# Patient Record
Sex: Female | Born: 1990 | Race: Black or African American | Hispanic: No | Marital: Single | Smoking: Never smoker
Health system: Southern US, Community
[De-identification: ages and names within clinical notes are randomized; demographics above are authoritative.]

## PROBLEM LIST (undated history)

## (undated) DIAGNOSIS — Z789 Other specified health status: Secondary | ICD-10-CM

## (undated) HISTORY — PX: HERNIA REPAIR: SHX51

---
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
## 2012-04-26 ENCOUNTER — Telehealth (HOSPITAL_COMMUNITY): Payer: Self-pay | Admitting: *Deleted

## 2012-04-26 ENCOUNTER — Encounter (HOSPITAL_COMMUNITY): Payer: Self-pay | Admitting: *Deleted

## 2012-04-26 NOTE — Telephone Encounter (Signed)
Preadmission screen  

## 2012-05-03 ENCOUNTER — Inpatient Hospital Stay (HOSPITAL_COMMUNITY)
Admission: AD | Admit: 2012-05-03 | Discharge: 2012-05-06 | DRG: 775 | Disposition: A | Discharge status: Home / Self Care | Payer: Medicaid Other | Source: Ambulatory Visit | Attending: Obstetrics and Gynecology | Admitting: Obstetrics and Gynecology

## 2012-05-03 ENCOUNTER — Encounter (HOSPITAL_COMMUNITY): Payer: Self-pay | Admitting: *Deleted

## 2012-05-03 DIAGNOSIS — Z22330 Carrier of Group B streptococcus: Secondary | ICD-10-CM

## 2012-05-03 DIAGNOSIS — O99892 Other specified diseases and conditions complicating childbirth: Principal | ICD-10-CM | POA: Diagnosis present

## 2012-05-03 HISTORY — DX: Other specified health status: Z78.9

## 2012-05-03 NOTE — MAU Note (Signed)
Pt G1 at 40.5wks having contractions.  Denies leaking or problems with pregnancy.

## 2012-05-04 ENCOUNTER — Encounter (HOSPITAL_COMMUNITY): Payer: Self-pay | Admitting: *Deleted

## 2012-05-04 LAB — CBC
Hemoglobin: 12.7 g/dL (ref 12.0–15.0)
RBC: 4.1 MIL/uL (ref 3.87–5.11)
WBC: 12.1 10*3/uL — ABNORMAL HIGH (ref 4.0–10.5)

## 2012-05-04 LAB — RPR: RPR Ser Ql: NONREACTIVE

## 2012-05-04 LAB — ABO/RH: ABO/RH(D): O POS

## 2012-05-04 MED ORDER — PRENATAL MULTIVITAMIN CH
1.0000 | ORAL_TABLET | Freq: Every day | ORAL | Status: DC
  Filled 2012-05-04: qty 1

## 2012-05-04 MED ORDER — FLEET ENEMA 7-19 GM/118ML RE ENEM: 1.0000 | ENEMA | RECTAL | Status: DC | PRN

## 2012-05-04 MED ORDER — ZOLPIDEM TARTRATE 5 MG PO TABS: 5.0000 mg | ORAL_TABLET | Freq: Every evening | ORAL | Status: DC | PRN

## 2012-05-04 MED ORDER — IBUPROFEN 600 MG PO TABS
600.0000 mg | ORAL_TABLET | Freq: Four times a day (QID) | ORAL | Status: DC
  Filled 2012-05-04: qty 1

## 2012-05-04 MED ORDER — DIPHENHYDRAMINE HCL 25 MG PO CAPS: 25.0000 mg | ORAL_CAPSULE | Freq: Four times a day (QID) | ORAL | Status: DC | PRN

## 2012-05-04 MED ORDER — PENICILLIN G POTASSIUM 5000000 UNITS IJ SOLR
2.5000 10*6.[IU] | INTRAVENOUS | Status: DC
  Administered 2012-05-04: 2.5 10*6.[IU] via INTRAVENOUS
  Filled 2012-05-04 (×3): qty 2.5

## 2012-05-04 MED ORDER — ONDANSETRON HCL 4 MG/2ML IJ SOLN: 4.0000 mg | INTRAMUSCULAR | Status: DC | PRN

## 2012-05-04 MED ORDER — SENNOSIDES-DOCUSATE SODIUM 8.6-50 MG PO TABS
2.0000 | ORAL_TABLET | Freq: Every day | ORAL | Status: DC
  Administered 2012-05-04: 2 via ORAL

## 2012-05-04 MED ORDER — SIMETHICONE 80 MG PO CHEW: 80.0000 mg | CHEWABLE_TABLET | ORAL | Status: DC | PRN

## 2012-05-04 MED ORDER — BENZOCAINE-MENTHOL 20-0.5 % EX AERO
1.0000 | INHALATION_SPRAY | CUTANEOUS | Status: DC | PRN
Start: 2012-05-04 — End: 2012-05-06
  Filled 2012-05-04: qty 56

## 2012-05-04 MED ORDER — WITCH HAZEL-GLYCERIN EX PADS
1.0000 | MEDICATED_PAD | CUTANEOUS | Status: DC | PRN
Start: 2012-05-04 — End: 2012-05-06

## 2012-05-04 MED ORDER — IBUPROFEN 100 MG/5ML PO SUSP
600.0000 mg | Freq: Four times a day (QID) | ORAL | Status: DC
  Administered 2012-05-04 – 2012-05-06 (×9): 600 mg via ORAL
  Filled 2012-05-04 (×13): qty 30

## 2012-05-04 MED ORDER — OXYTOCIN BOLUS FROM INFUSION
500.0000 mL | Freq: Once | INTRAVENOUS | Status: DC
  Filled 2012-05-04: qty 1000
  Filled 2012-05-04: qty 500

## 2012-05-04 MED ORDER — OXYCODONE-ACETAMINOPHEN 5-325 MG PO TABS: 1.0000 | ORAL_TABLET | ORAL | Status: DC | PRN

## 2012-05-04 MED ORDER — PENICILLIN G POTASSIUM 5000000 UNITS IJ SOLR
5.0000 10*6.[IU] | Freq: Once | INTRAMUSCULAR | Status: AC
  Administered 2012-05-04: 5 10*6.[IU] via INTRAVENOUS
  Filled 2012-05-04: qty 5

## 2012-05-04 MED ORDER — IBUPROFEN 600 MG PO TABS: 600.0000 mg | ORAL_TABLET | Freq: Four times a day (QID) | ORAL | Status: DC | PRN

## 2012-05-04 MED ORDER — LANOLIN HYDROUS EX OINT: TOPICAL_OINTMENT | CUTANEOUS | Status: DC | PRN

## 2012-05-04 MED ORDER — LIDOCAINE HCL (PF) 1 % IJ SOLN
30.0000 mL | INTRAMUSCULAR | Status: DC | PRN
  Filled 2012-05-04: qty 30

## 2012-05-04 MED ORDER — TETANUS-DIPHTH-ACELL PERTUSSIS 5-2.5-18.5 LF-MCG/0.5 IM SUSP: 0.5000 mL | Freq: Once | INTRAMUSCULAR | Status: DC

## 2012-05-04 MED ORDER — CITRIC ACID-SODIUM CITRATE 334-500 MG/5ML PO SOLN: 30.0000 mL | ORAL | Status: DC | PRN

## 2012-05-04 MED ORDER — ONDANSETRON HCL 4 MG/2ML IJ SOLN
4.0000 mg | Freq: Four times a day (QID) | INTRAMUSCULAR | Status: DC | PRN
  Administered 2012-05-04: 4 mg via INTRAVENOUS
  Filled 2012-05-04: qty 2

## 2012-05-04 MED ORDER — LACTATED RINGERS IV SOLN
INTRAVENOUS | Status: DC
  Administered 2012-05-04: 125 mL/h via INTRAVENOUS

## 2012-05-04 MED ORDER — OXYTOCIN 20 UNITS IN LACTATED RINGERS INFUSION - SIMPLE: 125.0000 mL/h | INTRAVENOUS | Status: AC

## 2012-05-04 MED ORDER — LACTATED RINGERS IV SOLN: 500.0000 mL | INTRAVENOUS | Status: DC | PRN

## 2012-05-04 MED ORDER — BUTORPHANOL TARTRATE 2 MG/ML IJ SOLN
1.0000 mg | Freq: Once | INTRAMUSCULAR | Status: AC
  Administered 2012-05-04: 1 mg via INTRAVENOUS
  Filled 2012-05-04: qty 1

## 2012-05-04 MED ORDER — ONDANSETRON HCL 4 MG PO TABS: 4.0000 mg | ORAL_TABLET | ORAL | Status: DC | PRN

## 2012-05-04 MED ORDER — ACETAMINOPHEN 325 MG PO TABS: 650.0000 mg | ORAL_TABLET | ORAL | Status: DC | PRN

## 2012-05-04 MED ORDER — MEASLES, MUMPS & RUBELLA VAC ~~LOC~~ INJ
0.5000 mL | INJECTION | Freq: Once | SUBCUTANEOUS | Status: DC
  Filled 2012-05-04: qty 0.5

## 2012-05-04 MED ORDER — OXYTOCIN 20 UNITS IN LACTATED RINGERS INFUSION - SIMPLE: 125.0000 mL/h | Freq: Once | INTRAVENOUS | Status: DC

## 2012-05-04 MED ORDER — DIBUCAINE 1 % RE OINT
1.0000 | TOPICAL_OINTMENT | RECTAL | Status: DC | PRN
Start: 2012-05-04 — End: 2012-05-06

## 2012-05-04 NOTE — Progress Notes (Signed)
UR chart review completed.  

## 2012-05-04 NOTE — MAU Note (Signed)
Dr. Ambrose Mantle notified of pt, orders to watch and recheck in one hour rec'd.

## 2012-05-04 NOTE — H&P (Signed)
[name], Rasmussen NO.:  [id number]  MEDICAL RECORD NO.:  [id number]  LOCATION:  9165                          FACILITY:  [facility]  PHYSICIAN:  Malachi Pro. Ambrose Mantle, M.D. DATE OF BIRTH:  January 24, 1991  DATE OF ADMISSION:  05/04/2012 DATE OF DISCHARGE:                             HISTORY & PHYSICAL   HISTORY OF PRESENT ILLNESS:  This is an 21 year old black female, para 0, gravida 1, EDC Apr 27, 2012, admitted in labor.  Blood group and type O positive, negative antibody, rubella immune, RPR nonreactive.  Urine culture negative.  Hepatitis B surface antigen negative.  HIV negative. GC and Chlamydia negative.  Hemoglobin AA trimester screen negative. Cystic fibrosis negative.  AFP negative.  1-hour Glucola 65.  Group B strep positive.  The patient's prenatal course was essentially uncomplicated.  She came to the office on May 03, 2012 with irregular contractions.  Her cervix was 1 cm dilated.  She returned to Maternity Admission Unit late on May 03, 2012, and the cervix was 1+ cm.  She was observed in Maternity Admission Unit.  Progressed to 3 cm, was admitted to the hospital, declined an epidural, progressed to 7 cm, and then to full dilatation with intact membranes.  She delivered spontaneously OA over an intact perineum a living female infant.  Weight is pending. Apgars were 9 and 9 at 1 and 5 minutes.  Placenta was intact.  The uterus was normal.  There were no lacerations repaired.  Blood loss was about 300 mL.  PAST MEDICAL HISTORY:  Negative.  SURGICAL HISTORY:  Hernia repair at 31 months of age, bilateral hernias.  ALLERGIES:  No known allergies.  FAMILY HISTORY:  Mother with anxiety, arthritis, depression.  Maternal grandmother with breast cancer.  Paternal grandfather with a heart attack and paternal grandmother with a stroke.  PHYSICAL EXAMINATION:  VITAL SIGNS:  Blood pressure 125/54, temperature 98.4, pulse 87, respirations 20. HEART:  Normal size and sounds.   No murmurs. LUNGS:  Clear to auscultation. ABDOMEN:  Soft.  The patient is now postpartum.  ADMITTING IMPRESSION:  Intrauterine pregnancy at 40 weeks and 6 days, with delivery.  The patient is prepared for postpartum care.     Malachi Pro. Ambrose Mantle, M.D.    TFH/MEDQ  D:  05/04/2012  T:  05/04/2012  Job:  [id number]

## 2012-05-04 NOTE — MAU Note (Signed)
Dr. Ambrose Mantle notified of Pt SVE, orders rec'd for admission.

## 2012-05-05 LAB — CBC
MCH: 30.2 pg (ref 26.0–34.0)
MCHC: 32.8 g/dL (ref 30.0–36.0)
Platelets: 214 10*3/uL (ref 150–400)
RBC: 3.41 MIL/uL — ABNORMAL LOW (ref 3.87–5.11)

## 2012-05-05 NOTE — Progress Notes (Signed)
Patient ID: Lindsey Rasmussen, female   DOB: Jul 02, 1991, 21 y.o.   MRN: [id number] #1 afebrile BP normal No problems

## 2012-05-05 NOTE — Clinical Social Work Psychosocial (Signed)
    Clinical Social Work Department BRIEF PSYCHOSOCIAL ASSESSMENT 05/05/2012  Patient:  Lindsey Rasmussen, Lindsey Rasmussen     Account Number:  [id number]     Admit date:  05/03/2012  Clinical Social Worker:  Andy Gauss  Date/Time:  05/05/2012 12:00 N  Referred by:  Physician  Date Referred:  05/05/2012 Referred for  Other - See comment   Other Referral:   Social situation   Interview type:  Patient Other interview type:    PSYCHOSOCIAL DATA Living Status:  PARENTS Admitted from facility:   Level of care:   Primary support name:  Crista Luria Primary support relationship to patient:  PARENT Degree of support available:   Involved    CURRENT CONCERNS Current Concerns  None Noted   Other Concerns:    SOCIAL WORK ASSESSMENT / PLAN Sw referral received to assess pt's "flat affect and questionable support system."  Pt lives with her mother, who she described as supportive.  FOB is supportive and involved, as per the pt.  She denies any depression or SI hx.  Pt answered this Sw questions appropriately, as Sw observed her bonding well with the infant.  She reports feeling comfortable handling the infant and expressed happiness about becoming a mother.  She has all the necessary supplies for the infant.  Pt did not appear to be "flat" during the conversation.  Sw is available to reassess if further concerns arise.   Assessment/plan status:  No Further Intervention Required Other assessment/ plan:   Information/referral to community resources:    PATIENT'S/FAMILY'S RESPONSE TO PLAN OF CARE: Pt was receptive to consult reason and cooperative.

## 2012-05-06 ENCOUNTER — Encounter (HOSPITAL_COMMUNITY): Payer: Self-pay | Admitting: Obstetrics and Gynecology

## 2012-05-06 MED ORDER — OXYCODONE-ACETAMINOPHEN 5-325 MG PO TABS: 1.0000 | ORAL_TABLET | Freq: Four times a day (QID) | ORAL | Status: AC | PRN

## 2012-05-06 MED ORDER — IBUPROFEN 100 MG/5ML PO SUSP: 800.0000 mg | Freq: Three times a day (TID) | ORAL | Status: DC | PRN

## 2012-05-06 NOTE — Discharge Summary (Signed)
Obstetric Discharge Summary Reason for Admission: onset of labor Prenatal Procedures: none Intrapartum Procedures: spontaneous vaginal delivery Postpartum Procedures: none Complications-Operative and Postpartum: none Hemoglobin  Date Value Range Status  05/05/2012 10.3* 12.0-15.0 (g/dL) Final     DELTA CHECK NOTED     REPEATED TO VERIFY     HCT  Date Value Range Status  05/05/2012 31.4* 36.0-46.0 (%) Final    Physical Exam:  General: alert and no distress Lochia: appropriate Uterine Fundus: firm   Discharge Diagnoses: Term Pregnancy-delivered  Discharge Information: Date: 05/06/2012 Activity: pelvic rest Diet: routine Medications: Ibuprofen, Percocet and vitamins Condition: stable Instructions: refer to practice specific booklet Discharge to: home Follow-up Information    Follow up with Bing Plume, MD. Schedule an appointment as soon as possible for a visit in 6 weeks.   Contact information:   [employer] [employer] [address] [place], [address] [place] [address] [phone]          Newborn Data: Live born female  Birth Weight: 6 lb 12.3 oz (3070 g) APGAR: 9,   Home with mother.  Lindsey Rasmussen,Lindsey Rasmussen 05/06/2012, 12:05 PM

## 2012-05-06 NOTE — Progress Notes (Signed)
Post Partum Day 2 Subjective: no complaints, up ad lib, tolerating PO and nl lochia, pain controlled  Objective: Blood pressure 131/85, pulse 66, temperature 98.6 F (37 C), temperature source Oral, resp. rate 20, height 5\' 6"  (1.676 m), weight 106.595 kg (235 lb), last menstrual period 08/20/2011, SpO2 99.00%, unknown if currently breastfeeding.  Physical Exam:  General: alert and no distress Lochia: appropriate Uterine Fundus: firm   Basename 05/05/12 0537 05/04/12 0240  HGB 10.3* 12.7  HCT 31.4* 37.2    Assessment/Plan: Discharge home  D/c with motrin/percocet/pnv.  F/u 6 weeks   LOS: 3 days   BOVARD,Forrestine Lecrone 05/06/2012, 11:47 AM

## 2012-05-08 ENCOUNTER — Inpatient Hospital Stay (HOSPITAL_COMMUNITY): Admission: RE | Admit: 2012-05-08 | Payer: Medicaid Other | Source: Ambulatory Visit

## 2013-05-11 ENCOUNTER — Encounter (HOSPITAL_COMMUNITY): Payer: Self-pay | Admitting: Emergency Medicine

## 2013-05-11 ENCOUNTER — Emergency Department (HOSPITAL_COMMUNITY): Payer: Self-pay

## 2013-05-11 ENCOUNTER — Emergency Department (INDEPENDENT_AMBULATORY_CARE_PROVIDER_SITE_OTHER): Payer: Self-pay

## 2013-05-11 ENCOUNTER — Emergency Department (INDEPENDENT_AMBULATORY_CARE_PROVIDER_SITE_OTHER)
Admission: EM | Admit: 2013-05-11 | Discharge: 2013-05-11 | Disposition: A | Discharge status: Home / Self Care | Payer: Self-pay | Source: Home / Self Care

## 2013-05-11 DIAGNOSIS — S9000XA Contusion of unspecified ankle, initial encounter: Secondary | ICD-10-CM

## 2013-05-11 DIAGNOSIS — S9001XA Contusion of right ankle, initial encounter: Secondary | ICD-10-CM

## 2013-05-11 NOTE — ED Notes (Signed)
Pt states she fell today and hurt her right knee and ankle. 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 Used an ice pack on ankle with little relief. Patient is alert and oriented.

## 2013-05-11 NOTE — ED Provider Notes (Signed)
History     CSN: [id number]  Arrival date & time 05/11/13  1324   None     Chief Complaint  Patient presents with  . Fall    (Consider location/radiation/quality/duration/timing/severity/associated sxs/prior treatment) HPI Comments: Pt slipped on a puddle in a classroom at 10am today, landing on R side of body when she fell.  Reports R knee and R ankle both hurt, but R knee is mild.  Pt is very worried about R ankle.   Patient is a 22 y.o. female presenting with fall. The history is provided by the patient.  Fall The accident occurred 3 to 5 hours ago. The fall occurred while walking. She landed on a hard floor. The point of impact was the right knee (right ankle). The pain is present in the right knee (r ankle). The pain is moderate. She was ambulatory at the scene. Pertinent negatives include no numbness, no loss of consciousness and no tingling. The symptoms are aggravated by ambulation and standing. She has tried ice for the symptoms. The treatment provided no relief.    Past Medical History  Diagnosis Date  . No pertinent past medical history   . SVD (spontaneous vaginal delivery) 05/06/2012    Past Surgical History  Procedure Laterality Date  . Hernia repair      bilateral at 4 mos    Family History  Problem Relation Age of Onset  . Anxiety disorder Mother   . Arthritis Mother   . Depression Mother   . Cancer Maternal Grandmother     breast  . Stroke Paternal Grandmother   . Heart attack Paternal Grandfather     History  Substance Use Topics  . Smoking status: Never Smoker   . Smokeless tobacco: Not on file  . Alcohol Use: No    OB History   Grav Para Term Preterm Abortions TAB SAB Ect Mult Living   1 1 1       1       Review of Systems  Musculoskeletal:       R ankle and knee pain after fall  Skin: Negative for color change and wound.  Neurological: Negative for tingling, loss of consciousness and numbness.    Allergies  Review of patient's  allergies indicates no known allergies.  Home Medications   Current Outpatient Rx  Name  Route  Sig  Dispense  Refill  . Pediatric Multivit-Minerals-C (FLINTSTONES GUMMIES PO)   Oral   Take 1 tablet by mouth daily.         Marland Kitchen ibuprofen (ADVIL,MOTRIN) 100 MG/5ML suspension   Oral   Take 40 mLs (800 mg total) by mouth every 8 (eight) hours as needed for fever.   473 mL   2     BP 146/90  Pulse 62  Temp(Src) 98.4 F (36.9 C) (Oral)  Resp 18  SpO2 100%  Breastfeeding? Unknown  Physical Exam  Constitutional: She appears well-developed and well-nourished. No distress.  HENT:  Head: Normocephalic and atraumatic.  Musculoskeletal:       Right knee: She exhibits abnormal patellar mobility. She exhibits normal range of motion, no swelling, no LCL laxity, no bony tenderness and no MCL laxity. Tenderness found. Lateral joint line tenderness noted.       Right ankle: She exhibits swelling. She exhibits normal range of motion, no deformity and normal pulse. Tenderness. Lateral malleolus and medial malleolus tenderness found. Achilles tendon exhibits no pain.  Pain with ROM R ankle  Skin: Skin is warm, dry  and intact. No abrasion and no bruising noted.    ED Course  Procedures (including critical care time)  Labs Reviewed - No data to display Dg Ankle Complete Right  05/11/2013   *RADIOLOGY REPORT*  Clinical Data: Right ankle pain after fall  RIGHT ANKLE - COMPLETE 3+ VIEW  Comparison: None.  Findings: No fracture or dislocation is noted.  Joint spaces are intact. No soft tissue abnormality is noted.  IMPRESSION: Normal right ankle.   Original Report Authenticated By: Lupita Raider.,  M.D.     1. Ankle contusion, right, initial encounter       MDM  Pt to soak ankle in epsom salt and warm water twice a day, can also use. Given work note for tomorrow since has a job where she walks all day and it still hurts to walk.         Cathlyn Parsons, NP 05/11/13 [phone]

## 2013-05-14 NOTE — ED Provider Notes (Signed)
Medical screening examination/treatment/procedure(s) were performed by non-physician practitioner and as supervising physician I was immediately available for consultation/collaboration.   [facility]; MD  Sharin Grave, MD 05/14/13 [phone]

## 2014-06-24 ENCOUNTER — Encounter (HOSPITAL_COMMUNITY): Payer: Self-pay | Admitting: Emergency Medicine

## 2014-06-24 ENCOUNTER — Emergency Department (HOSPITAL_COMMUNITY)
Admission: EM | Admit: 2014-06-24 | Discharge: 2014-06-25 | Disposition: A | Discharge status: Home / Self Care | Payer: Medicaid Other | Attending: Emergency Medicine | Admitting: Emergency Medicine

## 2014-06-24 ENCOUNTER — Emergency Department (HOSPITAL_COMMUNITY): Payer: Medicaid Other

## 2014-06-24 DIAGNOSIS — L03119 Cellulitis of unspecified part of limb: Secondary | ICD-10-CM

## 2014-06-24 DIAGNOSIS — IMO0002 Reserved for concepts with insufficient information to code with codable children: Secondary | ICD-10-CM | POA: Insufficient documentation

## 2014-06-24 DIAGNOSIS — Z792 Long term (current) use of antibiotics: Secondary | ICD-10-CM | POA: Insufficient documentation

## 2014-06-24 MED ORDER — CLINDAMYCIN HCL 150 MG PO CAPS: 450.0000 mg | ORAL_CAPSULE | Freq: Three times a day (TID) | ORAL | Status: DC

## 2014-06-24 NOTE — ED Notes (Addendum)
Pt states elbows was feeling strange and stiff over weekend; now it is hot and very painful to touch. Denies injury.

## 2014-06-24 NOTE — Discharge Instructions (Signed)
1. Medications: clindamycin, usual home medications 2. Treatment: rest, drink plenty of fluids, warm compresses 3. Follow Up: Please followup with your primary doctor for discussion of your diagnoses and further evaluation after today's visit; if you do not have a primary care doctor use the resource guide provided to find one;   Cellulitis Cellulitis is an infection of the skin and the tissue beneath it. The infected area is usually red and tender. Cellulitis occurs most often in the arms and lower legs.  CAUSES  Cellulitis is caused by bacteria that enter the skin through cracks or cuts in the skin. The most common types of bacteria that cause cellulitis are Staphylococcus and Streptococcus. SYMPTOMS   Redness and warmth.  Swelling.  Tenderness or pain.  Fever. DIAGNOSIS  Your caregiver can usually determine what is wrong based on a physical exam. Blood tests may also be done. TREATMENT  Treatment usually involves taking an antibiotic medicine. HOME CARE INSTRUCTIONS   Take your antibiotics as directed. Finish them even if you start to feel better.  Keep the infected arm or leg elevated to reduce swelling.  Apply a warm cloth to the affected area up to 4 times per day to relieve pain.  Only take over-the-counter or prescription medicines for pain, discomfort, or fever as directed by your caregiver.  Keep all follow-up appointments as directed by your caregiver. SEEK MEDICAL CARE IF:   You notice red streaks coming from the infected area.  Your red area gets larger or turns dark in color.  Your bone or joint underneath the infected area becomes painful after the skin has healed.  Your infection returns in the same area or another area.  You notice a swollen bump in the infected area.  You develop new symptoms. SEEK IMMEDIATE MEDICAL CARE IF:   You have a fever.  You feel very sleepy.  You develop vomiting or diarrhea.  You have a general ill feeling (malaise)  with muscle aches and pains. MAKE SURE YOU:   Understand these instructions.  Will watch your condition.  Will get help right away if you are not doing well or get worse. Document Released: 09/22/2005 Document Revised: 06/13/2012 Document Reviewed: 02/28/2012 [facility] Patient Information 2015 [employer], [place]. This information is not intended to replace advice given to you by your health care provider. Make sure you discuss any questions you have with your health care provider.   Emergency Department Resource Guide 1) Find a Doctor and Pay Out of Pocket Although you won't have to find out who is covered by your insurance plan, it is a good idea to ask around and get recommendations. You will then need to call the office and see if the doctor you have chosen will accept you as a new patient and what types of options they offer for patients who are self-pay. Some doctors offer discounts or will set up payment plans for their patients who do not have insurance, but you will need to ask so you aren't surprised when you get to your appointment.  2) Contact Your Local Health Department Not all health departments have doctors that can see patients for sick visits, but many do, so it is worth a call to see if yours does. If you don't know where your local health department is, you can check in your phone book. The CDC also has a tool to help you locate your state's health department, and many state websites also have listings of all of their local health departments.  3) Find a Walk-in Clinic If your illness is not likely to be very severe or complicated, you may want to try a walk in clinic. These are popping up all over the country in pharmacies, drugstores, and shopping centers. They're usually staffed by nurse practitioners or physician assistants that have been trained to treat common illnesses and complaints. They're usually fairly quick and inexpensive. However, if you have serious medical issues  or chronic medical problems, these are probably not your best option.  No Primary Care Doctor: - Call Health Connect at  [phone] - they can help you locate a primary care doctor that  accepts your insurance, provides certain services, etc. - Physician Referral Service- [phone]  Chronic Pain Problems: Organization         Address  Phone   Notes  [facility]  [phone] Patients need to be referred by their primary care doctor.   Medication Assistance: Organization         Address  Phone   Notes  [facility] [facility] [address] [place], [place] [address] [phone]) [phone] --Must be a resident of [facility] -- Must have NO insurance coverage whatsoever (no Medicaid/ Medicare, etc.) -- The pt. MUST have a primary care doctor that directs their care regularly and follows them in the community   MedAssist  [phone]   [employer]  [phone]    Agencies that provide inexpensive medical care: Organization         Address  Phone   Notes  [facility]  [phone]   [facility]    [phone]) [phone]   [facility] [address] [place], [place] [address] [phone]   [facility] [place] [address], [place] [phone]   Planned Parenthood    [phone](336) [phone]   [facility]    [phone](336) [phone]   Community Health and [facility]  [address], [place] Phone:  [phone]) [phone], Fax:  [phone] Hours of Operation:  9 am - 6 pm, M-F.  Also accepts Medicaid/Medicare and self-pay.  [facility]  [address], [place] Phone: [phone]) [phone], Fax: [phone]. Hours of Operation:  8:30 am - 5:30 pm, M-F.  Also accepts Medicaid and self-pay.  [facility] [place] [address], [place] Phone: [phone]) [phone]   [facility] [address], [place], [place]  [phone], Ext. [phone] Mondays & Thursdays: 7-9 AM.  First 15 patients are seen on a first come, first serve basis.    Medicaid-accepting [facility] Providers:  Organization         Address  Phone   Notes  [facility] [address], [place] [phone] Also accepts self-pay patients.  [facility] [address], [place]  [phone]) [phone]   [facility] [address], [address], [place] [phone]) [phone]   [facility] [address] [place] [address], [place] [phone]) [phone]   Renaye RakersVeita Bland [address], [place]   [phone] Only accepts [place] Access [place]icaid patients after they have their name applied to their card.   Self-Pay (no insurance) in [facility] [place]:  Organization         Address  Phone   Notes  Sickle Cell Patients, [facility] [phone]  [address], [place] [phone]   [facility] [address], [place] [phone]) [phone]   [facility] [place]  [address], [place] [phone]) [phone]   [facility]/Dr. Osei-Bonsu  [address] [place] [address], [place] or [id number], [place] [phone]) [phone] Phone number for both [place] and [place] locations is the same.  [facility]gent [facility] and [facility] [address], [place] [phone]) [phone]   [facility] [place] [address] [place] [address], [place] or [address] [phone]   [facility] [address], [place] [phone](336) [phone], phone; [phone]) [phone], fax Sees patients 1st and 3rd Saturday of every month.  Must not qualify for public or private insurance (i.e. Medicaid, Medicare, [place] Health Choice, Veterans' Benefits)  Household income should be no more than 200% of the poverty level The clinic cannot treat you if you are pregnant or think you are pregnant  Sexually transmitted  diseases are not treated at the clinic.    Dental Care: Organization         Address  Phone  Notes  [facility] of [facility] [address], [place] [phone]) [phone] Accepts children up to age 21 who are enrolled in [place]icaid or [place] Health Choice; pregnant women with a Medicaid card; and children who have applied for Medicaid or [place] Health Choice, but were declined, whose parents can pay a reduced fee at time of service.  [facility] of [facility] [place]  [address], [place] [phone]) [phone] Accepts children up to age 21 who are enrolled in [place]icaid or [place] Health Choice; pregnant women with a Medicaid card; and children who have applied for Medicaid or [place] Health Choice, but were declined, whose parents can pay a reduced fee at time of service.  [facility]  [address], [place] [phone]) [phone] Patients are seen by appointment only. Walk-ins are not accepted. [facility] will see patients 21 years of age and older. Monday - Tuesday (8am-5pm) Most Wednesdays (8:30-5pm) $30 per visit, cash only  [facility]  [address], [facility] [phone]) [phone] Patients are seen by appointment only. Walk-ins are not accepted. [facility] will see patients 21 years of age and older. One Wednesday Evening (Monthly: Volunteer Based).  $30 per visit, cash only  [employer] [facility]  [phone]) [phone] for adults; Children under age 504, call Graduate Pediatric Dentistry at [phone]. Children aged 174-14, please call [phone]) [phone] to request a pediatric application.  Dental services are provided in all areas of dental care including fillings, crowns and bridges, complete and partial dentures, implants, gum treatment, root canals, and extractions. Preventive care is also provided. Treatment is provided to both adults and children. Patients are selected via a  lottery and there is often a waiting list.   [facility] [address], [place]  [phone]) [phone] [email or web address]   [facility] [address], [place], [place] [phone], Ext. [phone] Second and Fourth Thursday of each month, opens at 6:30 AM; Clinic ends at 9 AM.  Patients are seen on a first-come first-served basis, and a limited number are seen during each clinic.   [facility]  [address], [place], [place] [phone]) [phone]   Eligibility Requirements You must have lived in [place], or [place] counties for at least the last three months.   You cannot be eligible for state or federal sponsored National Cityhealthcare insurance, including CIGNAVeterans Administration, [place]icaid, or Harrah's EntertainmentMedicare.   You generally cannot be eligible for healthcare insurance through your employer.    How to apply: Eligibility screenings are held every Tuesday and Wednesday afternoon from 1:00 pm until 4:00 pm. You do not need an appointment for the interview!  [facility] [address], [place], [place] [phone]   [facility]  [phone]   [facility]  [phone]   [facility]  [phone]    Behavioral Health Resources in the Community: Intensive Outpatient Programs Organization         Address  Phone  Notes  [facility] [address], [place], [place] [phone]   [facility]patient [address], [place], [place] [phone]   [facility] [address], [place], [place]  [phone]   [facility] [address],  [place], [place] [phone] or [phone]   Substance Abuse Resources Organization         Address  Phone  Notes  [facility]  [phone]   [facility]  [phone]   [facility]  [phone]   [facility]  [phone]   Residential &  Outpatient Substance Abuse Program  [phone]   Psychological Services Organization         Address  Phone  Notes  [facility]  [phone]- [phone]   [facility]  [phone]   [facility] [address], [place] [phone] or [phone]    Mobile Crisis Teams Organization         Address  Phone  Notes  [facility], Mobile Crisis Care Unit  [phone]   [facility]  [address] [place], [place] [phone]   Doristine LocksSharon DeEsch [address] [place] [phone]    Self-Help/Support Groups Organization         Address  Phone             Notes  [facility]ental Health Assoc[facility] of [place] - variety of support groups  336- [phone] Call for more information  Narcotics Anonymous (NA), Caring Services [address], [place]  2 meetings at this location   Statisticianesidential Treatment Programs Organization         Address  Phone  Notes  [facility] [address],    [place]  [phone]   [facility]  [address], [place] [id number], [place], [place] [phone]   [facility] [address], [place] [phone] Admissions: 8am-3pm M-F  [facility] [address]    [place], [place] [phone]   [facility] [address], [place], [place] [phone]   [facility] [address]  [place], [place] [phone]   [facility] - Intensive Outpatient [address], [place], [place] [phone]   [facility] (Addiction Recovery Care Assoc[facility] [address]  [place], [place] [phone] or [phone]   [facility] (RTS) [address] [place], [place] [phone] Accepts Medicaid  [facility] [address]  [place] [phone] Substance Abuse/Addiction Treatment   [facility] Behavioral Health Resources Organization          Address  Phone  Notes  [facility]  [phone]   Angie FavaJulie Brannon, PhD [address], [address] [place], [place]   [phone]) [phone] or [phone]) [phone]   [facility]   [address] [place], [place] [phone](336) [phone]   [facility] [address], [place], [place] [phone](336) [phone] Insurance/Medicaid/sponsorship through [facility]  Faith and Families [address]                                    [place], [place] [phone](336) [phone] Therapy/tele-psych/case  [facility] [address].   [place], [place] [phone](336) [phone]    Dr. Lolly MustacheArfeen  [phone]) [phone]   [facility] [place]  [employer] [facility] 1) [address], [place] 2) [address], [place] 3)  [address], [place] [phone]) [phone](336) [phone]  [phone](336) [phone]   [facility] [place] Child Abuse Hotline [phone]) [phone] or [phone](336) [phone] (After Hours)

## 2014-06-24 NOTE — ED Notes (Signed)
Patient transported to X-ray without distress.  

## 2014-06-24 NOTE — ED Provider Notes (Signed)
CSN: [id number]     Arrival date & time 06/24/14  2156 History  This chart was scribed for non-physician practitioner Dalia HeadingHannah Muthersbough, PA-C working with Loren Raceravid Yelverton, MD by Joaquin MusicKristina Sanchez-Matthews, ED Scribe. This patient was seen in room TR08C/TR08C and the patient's care was started at  11:27 PM .   Chief Complaint  Patient presents with  . Elbow Pain   The history is provided by the patient. No language interpreter was used.   HPI Comments: Lindsey Rasmussen is a 23 y.o. female who presents to the Emergency Department complaining of acute R elbow pain that began 3 days ago. Pt states she woke up 3 days ago with pain, swelling and warmth to the R elbow; states she has been tolerating the pain but states the pain is now unbearable and has decreased ROM. Unsure of recent insect bites. Denies STD hx, recent steroid medication use, DM hx, daily medication use, medical problems, vaginal discharge, swelling or redness, fever, chills, nausea, and emesis.  Past Medical History  Diagnosis Date  . No pertinent past medical history   . SVD (spontaneous vaginal delivery) 05/06/2012   Past Surgical History  Procedure Laterality Date  . Hernia repair      bilateral at 4 mos   Family History  Problem Relation Age of Onset  . Anxiety disorder Mother   . Arthritis Mother   . Depression Mother   . Cancer Maternal Grandmother     breast  . Stroke Paternal Grandmother   . Heart attack Paternal Grandfather    History  Substance Use Topics  . Smoking status: Never Smoker   . Smokeless tobacco: Not on file  . Alcohol Use: No   OB History   Grav Para Term Preterm Abortions TAB SAB Ect Mult Living   1 1 1       1      Review of Systems  Constitutional: Negative for fever and chills.  Gastrointestinal: Negative for nausea and vomiting.  Genitourinary: Negative for vaginal discharge.  Musculoskeletal: Positive for myalgias.       Elbow pain  Skin: Negative for color change and rash.     Allergies  Review of patient's allergies indicates no known allergies.  Home Medications   Prior to Admission medications   Medication Sig Start Date End Date Taking? Authorizing Terea Neubauer  clindamycin (CLEOCIN) 150 MG capsule Take 3 capsules (450 mg total) by mouth 3 (three) times daily. 06/24/14   Hannah Muthersbaugh, PA-C   BP 128/81  Pulse 84  Temp(Src) 98.3 F (36.8 C) (Oral)  Resp 17  Ht 5\' 11"  (1.803 m)  Wt 222 lb (100.699 kg)  BMI 30.98 kg/m2  SpO2 99%  LMP 06/03/2014  Physical Exam  Nursing note and vitals reviewed. Constitutional: She is oriented to person, place, and time. She appears well-developed and well-nourished. No distress.  HENT:  Head: Normocephalic and atraumatic.  Eyes: Conjunctivae are normal.  Neck: Normal range of motion.  Cardiovascular: Normal rate, regular rhythm, normal heart sounds and intact distal pulses.   Capillary refill less than 3 seconds  Pulmonary/Chest: Effort normal and breath sounds normal. No respiratory distress.  Musculoskeletal: She exhibits tenderness. She exhibits no edema.  ROM: Full range of motion of the right shoulder, right wrist and all fingers of the right hand; mildly restricted motion of the right elbow due to pain however patient does freely movable below  Neurological: She is alert and oriented to person, place, and time. Coordination normal.  Sensation intact to  dull and sharp Strength 5/5 in the bilateral upper terminates including resisted flexion and extension of bilateral elbows  Skin: Skin is warm and dry. She is not diaphoretic. There is erythema.  No tenting of the skin Erythema to the dorsum of the right elbow, no circumferential erythema or swelling, no area of fluctuance or evidence of abscess  Psychiatric: She has a normal mood and affect.    ED Course  Procedures  DIAGNOSTIC STUDIES: Oxygen Saturation is 98% on RA, normal by my interpretation.    COORDINATION OF CARE: 11:30 PM-Discussed  treatment plan which includes discussed radiology findings. Will speak with attending regarding pts case. Pt agreed to plan.   Labs Review Labs Reviewed - No data to display  Imaging Review Dg Elbow Complete Right  06/24/2014   CLINICAL DATA:  Elbow pain and swelling  EXAM: RIGHT ELBOW - COMPLETE 3+ VIEW  COMPARISON:  None.  FINDINGS: There is no evidence of fracture, dislocation, or joint effusion. There is no evidence of arthropathy or other focal bone abnormality. Mild soft tissue swelling overlying the olecranon is noted.  IMPRESSION: 1. No acute bone abnormality. 2. Soft tissue swelling which may reflect cellulitis.   Electronically Signed   By: Signa Kellaylor  Stroud M.D.   On: 06/24/2014 22:48     EKG Interpretation None     MDM   Final diagnoses:  Cellulitis of elbow   Lindsey Rasmussen presents with swelling and pain to the posterior elbow.  No palpable or visual bursa. Pt without circumfrential erythema and swelling.  Mildly restricted ROM due to pain, but able to freely move elbow.  Pt is afebrile and non tachycardic.  No concern for septic arthritis.  Possible cellulitis bursitis vs skin cellulitis.  Pt is without risk factors for HIV; no recent use of steroids or other immunosuppressive medications; no Hx of diabetes.  Pt is without gross abscess for which I&D would be possible.  Area marked and pt encouraged to return if redness begins to streak, extends beyond the markings, and/or fever or nausea/vomiting develop.  Pt is alert, oriented, NAD, afebrile, non tachycardic, nonseptic and nontoxic appearing.  Pt to be d/c on oral antibiotics with strict f/u instructions.   I have personally reviewed patient's vitals, nursing note and any pertinent labs or imaging.  At this time, it has been determined that no acute conditions requiring further emergency intervention. The patient/guardian have been advised of the diagnosis and plan. I reviewed all labs and imaging including any potential incidental  findings. We have discussed signs and symptoms that warrant return to the ED, such as those listed above.  Patient/guardian has voiced understanding and agreed to follow-up with the PCP or specialist in 2 days.  Vital signs are stable at discharge.   BP 128/81  Pulse 84  Temp(Src) 98.3 F (36.8 C) (Oral)  Resp 17  Ht 5\' 11"  (1.803 m)  Wt 222 lb (100.699 kg)  BMI 30.98 kg/m2  SpO2 99%  LMP 06/03/2014  I personally performed the services described in this documentation, which was scribed in my presence. The recorded information has been reviewed and is accurate.      Dahlia ClientHannah Muthersbaugh, PA-C 06/25/14 0007

## 2014-06-25 NOTE — ED Provider Notes (Signed)
Medical screening examination/treatment/procedure(s) were performed by non-physician practitioner and as supervising physician I was immediately available for consultation/collaboration.   EKG Interpretation None        David Yelverton, MD 06/25/14 0552 

## 2014-10-13 ENCOUNTER — Emergency Department (HOSPITAL_COMMUNITY)
Admission: EM | Admit: 2014-10-13 | Discharge: 2014-10-13 | Disposition: A | Discharge status: Home / Self Care | Payer: Medicaid Other | Attending: Emergency Medicine | Admitting: Emergency Medicine

## 2014-10-13 ENCOUNTER — Encounter (HOSPITAL_COMMUNITY): Payer: Self-pay | Admitting: Emergency Medicine

## 2014-10-13 DIAGNOSIS — R110 Nausea: Secondary | ICD-10-CM | POA: Diagnosis not present

## 2014-10-13 DIAGNOSIS — R197 Diarrhea, unspecified: Secondary | ICD-10-CM

## 2014-10-13 DIAGNOSIS — R1033 Periumbilical pain: Secondary | ICD-10-CM

## 2014-10-13 DIAGNOSIS — Z3202 Encounter for pregnancy test, result negative: Secondary | ICD-10-CM | POA: Insufficient documentation

## 2014-10-13 LAB — COMPREHENSIVE METABOLIC PANEL
ALBUMIN: 4 g/dL (ref 3.5–5.2)
ALT: 16 U/L (ref 0–35)
ANION GAP: 11 (ref 5–15)
AST: 19 U/L (ref 0–37)
Alkaline Phosphatase: 69 U/L (ref 39–117)
BUN: 9 mg/dL (ref 6–23)
CALCIUM: 9.9 mg/dL (ref 8.4–10.5)
CO2: 25 mEq/L (ref 19–32)
CREATININE: 0.9 mg/dL (ref 0.50–1.10)
Chloride: 102 mEq/L (ref 96–112)
GFR calc non Af Amer: >90 mL/min (ref >90)
GLUCOSE: 84 mg/dL (ref 70–99)
Potassium: 3.9 mEq/L (ref 3.7–5.3)
Sodium: 138 mEq/L (ref 137–147)
TOTAL PROTEIN: 8.2 g/dL (ref 6.0–8.3)
Total Bilirubin: 0.4 mg/dL (ref 0.3–1.2)

## 2014-10-13 LAB — CBC WITH DIFFERENTIAL/PLATELET
BASOS PCT: 0 % (ref 0–1)
Basophils Absolute: 0 10*3/uL (ref 0.0–0.1)
EOS ABS: 0.1 10*3/uL (ref 0.0–0.7)
EOS PCT: 1 % (ref 0–5)
HEMATOCRIT: 38.5 % (ref 36.0–46.0)
HEMOGLOBIN: 13.2 g/dL (ref 12.0–15.0)
LYMPHS ABS: 2.8 10*3/uL (ref 0.7–4.0)
Lymphocytes Relative: 37 % (ref 12–46)
MCH: 30.6 pg (ref 26.0–34.0)
MCHC: 34.3 g/dL (ref 30.0–36.0)
MCV: 89.1 fL (ref 78.0–100.0)
MONO ABS: 0.4 10*3/uL (ref 0.1–1.0)
MONOS PCT: 5 % (ref 3–12)
NEUTROS PCT: 57 % (ref 43–77)
Neutro Abs: 4.3 10*3/uL (ref 1.7–7.7)
Platelets: 299 10*3/uL (ref 150–400)
RBC: 4.32 MIL/uL (ref 3.87–5.11)
RDW: 12.6 % (ref 11.5–15.5)
WBC: 7.5 10*3/uL (ref 4.0–10.5)

## 2014-10-13 LAB — URINALYSIS, ROUTINE W REFLEX MICROSCOPIC
Bilirubin Urine: NEGATIVE
Glucose, UA: NEGATIVE mg/dL
HGB URINE DIPSTICK: NEGATIVE
Ketones, ur: NEGATIVE mg/dL
Nitrite: NEGATIVE
PROTEIN: NEGATIVE mg/dL
SPECIFIC GRAVITY, URINE: 1.019 (ref 1.005–1.030)
Urobilinogen, UA: 1 mg/dL (ref 0.0–1.0)
pH: 6.5 (ref 5.0–8.0)

## 2014-10-13 LAB — POC URINE PREG, ED: PREG TEST UR: NEGATIVE

## 2014-10-13 LAB — URINE MICROSCOPIC-ADD ON

## 2014-10-13 MED ORDER — ONDANSETRON 4 MG PO TBDP: 4.0000 mg | ORAL_TABLET | Freq: Three times a day (TID) | ORAL | Status: DC | PRN

## 2014-10-13 MED ORDER — HYDROCODONE-ACETAMINOPHEN 7.5-325 MG/15ML PO SOLN: 7.5000 mg | Freq: Four times a day (QID) | ORAL | Status: DC | PRN

## 2014-10-13 MED ORDER — IBUPROFEN 100 MG/5ML PO SUSP: 400.0000 mg | Freq: Four times a day (QID) | ORAL | Status: DC | PRN

## 2014-10-13 NOTE — ED Provider Notes (Signed)
CSN: [id number]     Arrival date & time 10/13/14  1750 History   First MD Initiated Contact with Patient 10/13/14 2043     Chief Complaint  Patient presents with  . Diarrhea  . Abdominal Pain     (Consider location/radiation/quality/duration/timing/severity/associated sxs/prior Treatment) HPI Comments: Lindsey Rasmussen is a 23 y.o. female with a PSHx of bilateral hernia repair at 744 months of age, who presents to the ED with complaints of periumbilical/mid-abd pain (makes a line across her umbilicus in both lateral directions) and diarrhea x2 days. Pt states the pain began gradually 2 days ago, and is 5/10 intermittent crampy pain, nonradiating, worse with eating, and improved with drinking water. Reports that she believes it's due to her eating "junk food", but the pain has not kept her from tolerating foods or fluids. Endorses loose stools x4 episodes per day, with no mucous or blood, denies hematochezia or melena. 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 Denies fevers, chills, URI symptoms, CP, SOB, n/v, constipation, obstipation, reflux symptoms, dysuria, hematuria, malodorous urine, back pain, flank pain, vaginal discharge or bleeding, dyspareunia, recent travel, NSAID or EtOH use, recent abx or suspicous food intake, myalgias, arthralgias, or weakness. LMP 2 weeks ago, has nexplanon and has light spotting infrequently. Sexually active with one partner.  Patient is a 23 y.o. female presenting with abdominal pain. The history is provided by the patient. No language interpreter was used.  Abdominal Pain Pain location:  Periumbilical Pain quality: cramping   Pain radiates to:  Does not radiate Pain severity:  Mild (5/10) Onset quality:  Gradual Duration:  2 days Timing:  Intermittent Progression:  Improving Chronicity:  New Context: sick contacts (works at nursing home)   Context: not alcohol use, not laxative use, not recent illness, not recent travel and not  suspicious food intake   Relieved by:  Liquids (water) Worsened by:  Eating Ineffective treatments:  None tried Associated symptoms: diarrhea and nausea (occasionally when she eats junk food)   Associated symptoms: no anorexia, no belching, no chest pain, no chills, no constipation, no cough, no dysuria, no fatigue, no fever, no flatus, no hematochezia, no hematuria, no melena, no shortness of breath, no sore throat, no vaginal bleeding, no vaginal discharge and no vomiting   Risk factors: obesity   Risk factors: no alcohol abuse and no NSAID use     Past Medical History  Diagnosis Date  . No pertinent past medical history   . SVD (spontaneous vaginal delivery) 05/06/2012   Past Surgical History  Procedure Laterality Date  . Hernia repair      bilateral at 4 mos   Family History  Problem Relation Age of Onset  . Anxiety disorder Mother   . Arthritis Mother   . Depression Mother   . Cancer Maternal Grandmother     breast  . Stroke Paternal Grandmother   . Heart attack Paternal Grandfather    History  Substance Use Topics  . Smoking status: Never Smoker   . Smokeless tobacco: Not on file  . Alcohol Use: No   OB History   Grav Para Term Preterm Abortions TAB SAB Ect Mult Living   1 1 1       1      Review of Systems  Constitutional: Negative for fever, chills and fatigue.  HENT: Negative for congestion and sore throat.   Respiratory: Negative for cough and shortness of breath.   Cardiovascular: Negative for chest pain.  Gastrointestinal: Positive  for nausea (occasionally when she eats junk food), abdominal pain and diarrhea. Negative for vomiting, constipation, blood in stool, melena, hematochezia, abdominal distention, rectal pain, anorexia and flatus.  Genitourinary: Negative for dysuria, urgency, frequency, hematuria, flank pain, decreased urine volume, vaginal bleeding, vaginal discharge, difficulty urinating, menstrual problem and dyspareunia.  Musculoskeletal:  Negative for arthralgias, back pain and myalgias.  Skin: Negative for rash.  Neurological: Negative for dizziness, weakness, light-headedness and headaches.   10 Systems reviewed and are negative for acute change except as noted in the HPI.    Allergies  Review of patient's allergies indicates no known allergies.  Home Medications   Prior to Admission medications   Not on File   BP 146/78  Pulse 97  Temp(Src) 98 F (36.7 C) (Oral)  Resp 18  Ht 6' (1.829 m)  Wt 205 lb (92.987 kg)  BMI 27.80 kg/m2  SpO2 99% Physical Exam  Nursing note and vitals reviewed. Constitutional: She is oriented to person, place, and time. Vital signs are normal. She appears well-developed and well-nourished.  Non-toxic appearance. No distress.  Afebrile, nontoxic, NAD, sitting upright in bed and laughing with visitors in room  HENT:  Head: Normocephalic and atraumatic.  Nose: Nose normal.  Mouth/Throat: Oropharynx is clear and moist and mucous membranes are normal.  Eyes: Conjunctivae and EOM are normal. Right eye exhibits no discharge. Left eye exhibits no discharge.  Neck: Normal range of motion. Neck supple.  Cardiovascular: Normal rate, regular rhythm, normal heart sounds and intact distal pulses.  Exam reveals no gallop and no friction rub.   No murmur heard. Pulmonary/Chest: Effort normal and breath sounds normal. No respiratory distress. She has no decreased breath sounds. She has no wheezes. She has no rhonchi. She has no rales.  Abdominal: Soft. Normal appearance and bowel sounds are normal. She exhibits no distension. There is tenderness in the periumbilical area. There is no rigidity, no rebound, no guarding, no CVA tenderness, no tenderness at McBurney's point and negative Murphy's sign. No hernia.    Obese. Soft, nondistended, +BS throughout, with minimal tenderness in periumbilical/right middle quadrant but no murphy's or mcburney's TTP. no r/g/r. No CVA TTP. No hernias palpated with  valsalva, but pt has trace tenderness along the area she states is the scar, but reports this is chronic and unchanged.  Musculoskeletal: Normal range of motion.  Neurological: She is alert and oriented to person, place, and time.  Skin: Skin is warm, dry and intact. No rash noted.  Psychiatric: She has a normal mood and affect.    ED Course  Procedures (including critical care time) Labs Review Labs Reviewed  URINALYSIS, ROUTINE W REFLEX MICROSCOPIC - Abnormal; Notable for the following:    APPearance CLOUDY (*)    Leukocytes, UA SMALL (*)    All other components within normal limits  URINE MICROSCOPIC-ADD ON - Abnormal; Notable for the following:    Squamous Epithelial / LPF MANY (*)    Bacteria, UA FEW (*)    All other components within normal limits  URINE CULTURE  CBC WITH DIFFERENTIAL  COMPREHENSIVE METABOLIC PANEL  POC URINE PREG, ED    Imaging Review No results found.   EKG Interpretation None      MDM   Final diagnoses:  Periumbilical abdominal pain  Diarrhea  Nausea    22y/o female with abd pain x2 days and some diarrhea. Afebrile, nontoxic, well appearing, abd exam benign. Labs unremarkable, U/A contaminated therefore will send for culture but will not treat given  lack of urinary complaints today, likely just contaminated specimen. 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 Doubt need for stool sample or c.diff testing today. Will send home with antiemetics and pain meds. Discussed diet for diarrhea. Will have her see PCP in 1 week. Declined pain meds here. Tolerating PO well. I explained the diagnosis and have given explicit precautions to return to the ER including for any other new or worsening symptoms. The patient understands and accepts the medical plan as it's been dictated and I have answered their questions. Discharge instructions concerning home care and  prescriptions have been given. The patient is STABLE and is discharged to home in good condition.  BP 146/78  Pulse 97  Temp(Src) 98 F (36.7 C) (Oral)  Resp 18  Ht 6' (1.829 m)  Wt 205 lb (92.987 kg)  BMI 27.80 kg/m2  SpO2 99%   Meds ordered this encounter  Medications  . ibuprofen (CHILD IBUPROFEN) 100 MG/5ML suspension    Sig: Take 20 mLs (400 mg total) by mouth every 6 (six) hours as needed for fever, mild pain or moderate pain.    Dispense:  60 mL    Refill:  0    Order Specific Question:  Supervising Provider    Answer:  Eber HongMILLER, BRIAN D [[id number]]  . HYDROcodone-acetaminophen (HYCET) 7.5-325 mg/15 ml solution    Sig: Take 15 mLs (7.5 mg of hydrocodone total) by mouth every 6 (six) hours as needed for severe pain.    Dispense:  60 mL    Refill:  0    Order Specific Question:  Supervising Provider    Answer:  Eber HongMILLER, BRIAN D [[id number]]  . ondansetron (ZOFRAN ODT) 4 MG disintegrating tablet    Sig: Take 1 tablet (4 mg total) by mouth every 8 (eight) hours as needed for nausea or vomiting.    Dispense:  15 tablet    Refill:  0    Order Specific Question:  Supervising Provider    Answer:  Vida RollerMILLER, BRIAN D [address][id number]]      Alayzia Pavlock Strupp Camprubi-Soms, PA-C 10/13/14 2356

## 2014-10-13 NOTE — Discharge Instructions (Signed)
Use zofran as prescribed, as needed for nausea. Stay well hydrated with small sips of fluids throughout the day. Use ibuprofen and hycet as directed as needed for pain but don't drive while taking these. Follow a BRAT (banana-rice-applesauce-toast) diet as described below for the next 24-48 hours. The 'BRAT' diet is suggested, then progress to diet as tolerated as symptoms abate. Call if bloody stools, persistent diarrhea, vomiting, fever or abdominal pain. Return to ER for changing or worsening of symptoms.  Food Choices to Help Relieve Diarrhea When you have diarrhea, the foods you eat and your eating habits are very important. Choosing the right foods and drinks can help relieve diarrhea. Also, because diarrhea can last up to 7 days, you need to replace lost fluids and electrolytes (such as sodium, potassium, and chloride) in order to help prevent dehydration.  WHAT GENERAL GUIDELINES DO I NEED TO FOLLOW?  Slowly drink 1 cup (8 oz) of fluid for each episode of diarrhea. If you are getting enough fluid, your urine will be clear or pale yellow.  Eat starchy foods. Some good choices include white rice, white toast, pasta, low-fiber cereal, baked potatoes (without the skin), saltine crackers, and bagels.  Avoid large servings of any cooked vegetables.  Limit fruit to two servings per day. A serving is  cup or 1 small piece.  Choose foods with less than 2 g of fiber per serving.  Limit fats to less than 8 tsp (38 g) per day.  Avoid fried foods.  Eat foods that have probiotics in them. Probiotics can be found in certain dairy products.  Avoid foods and beverages that may increase the speed at which food moves through the stomach and intestines (gastrointestinal tract). Things to avoid include:  High-fiber foods, such as dried fruit, raw fruits and vegetables, nuts, seeds, and whole grain foods.  Spicy foods and high-fat foods.  Foods and beverages sweetened with high-fructose corn syrup,  honey, or sugar alcohols such as xylitol, sorbitol, and mannitol. WHAT FOODS ARE RECOMMENDED? Grains White rice. White, [place], or pita breads (fresh or toasted), including plain rolls, buns, or bagels. White pasta. Saltine, soda, or graham crackers. Pretzels. Low-fiber cereal. Cooked cereals made with water (such as cornmeal, farina, or cream cereals). Plain muffins. Matzo. Melba toast. Zwieback.  Vegetables Potatoes (without the skin). Strained tomato and vegetable juices. Most well-cooked and canned vegetables without seeds. Tender lettuce. Fruits Cooked or canned applesauce, apricots, cherries, fruit cocktail, grapefruit, peaches, pears, or plums. Fresh bananas, apples without skin, cherries, grapes, cantaloupe, grapefruit, peaches, oranges, or plums.  Meat and Other Protein Products Baked or boiled chicken. Eggs. Tofu. Fish. Seafood. Smooth peanut butter. Ground or well-cooked tender beef, ham, veal, lamb, pork, or poultry.  Dairy Plain yogurt, kefir, and unsweetened liquid yogurt. Lactose-free milk, buttermilk, or soy milk. Plain hard cheese. Beverages Sport drinks. Clear broths. Diluted fruit juices (except prune). Regular, caffeine-free sodas such as ginger ale. Water. Decaffeinated teas. Oral rehydration solutions. Sugar-free beverages not sweetened with sugar alcohols. Other Bouillon, broth, or soups made from recommended foods.  The items listed above may not be a complete list of recommended foods or beverages. Contact your dietitian for more options. WHAT FOODS ARE NOT RECOMMENDED? Grains Whole grain, whole wheat, bran, or rye breads, rolls, pastas, crackers, and cereals. Wild or brown rice. Cereals that contain more than 2 g of fiber per serving. Corn tortillas or taco shells. Cooked or dry oatmeal. Granola. Popcorn. Vegetables Raw vegetables. Cabbage, broccoli, Brussels sprouts, artichokes, baked beans, beet  greens, corn, kale, legumes, peas, sweet potatoes, and yams. Potato  skins. Cooked spinach and cabbage. Fruits Dried fruit, including raisins and dates. Raw fruits. Stewed or dried prunes. Fresh apples with skin, apricots, mangoes, pears, raspberries, and strawberries.  Meat and Other Protein Products Chunky peanut butter. Nuts and seeds. Beans and lentils. Tomasa BlaseBacon.  Dairy High-fat cheeses. Milk, chocolate milk, and beverages made with milk, such as milk shakes. Cream. Ice cream. Sweets and Desserts Sweet rolls, doughnuts, and sweet breads. Pancakes and waffles. Fats and Oils Butter. Cream sauces. Margarine. Salad oils. Plain salad dressings. Olives. Avocados.  Beverages Caffeinated beverages (such as coffee, tea, soda, or energy drinks). Alcoholic beverages. Fruit juices with pulp. Prune juice. Soft drinks sweetened with high-fructose corn syrup or sugar alcohols. Other Coconut. Hot sauce. Chili powder. Mayonnaise. Gravy. Cream-based or milk-based soups.  The items listed above may not be a complete list of foods and beverages to avoid. Contact your dietitian for more information. WHAT SHOULD I DO IF I BECOME DEHYDRATED? Diarrhea can sometimes lead to dehydration. Signs of dehydration include dark urine and dry mouth and skin. If you think you are dehydrated, you should rehydrate with an oral rehydration solution. These solutions can be purchased at pharmacies, retail stores, or online.  Drink -1 cup (120-240 mL) of oral rehydration solution each time you have an episode of diarrhea. If drinking this amount makes your diarrhea worse, try drinking smaller amounts more often. For example, drink 1-3 tsp (5-15 mL) every 5-10 minutes.  A general rule for staying hydrated is to drink 1-2 L of fluid per day. Talk to your health care provider about the specific amount you should be drinking each day. Drink enough fluids to keep your urine clear or pale yellow. Document Released: 03/04/2004 Document Revised: 12/18/2013 Document Reviewed: 11/05/2013 [facility] Patient  Information 2015 [place], [place]. This information is not intended to replace advice given to you by your health care provider. Make sure you discuss any questions you have with your health care provider.   Abdominal Pain Many things can cause belly (abdominal) pain. Most times, the belly pain is not dangerous. Many cases of belly pain can be watched and treated at home. HOME CARE   Do not take medicines that help you go poop (laxatives) unless told to by your doctor.  Only take medicine as told by your doctor.  Eat or drink as told by your doctor. Your doctor will tell you if you should be on a special diet. GET HELP IF:  You do not know what is causing your belly pain.  You have belly pain while you are sick to your stomach (nauseous) or have runny poop (diarrhea).  You have pain while you pee or poop.  Your belly pain wakes you up at night.  You have belly pain that gets worse or better when you eat.  You have belly pain that gets worse when you eat fatty foods.  You have a fever. GET HELP RIGHT AWAY IF:   The pain does not go away within 2 hours.  You keep throwing up (vomiting).  The pain changes and is only in the right or left part of the belly.  You have bloody or tarry looking poop. MAKE SURE YOU:   Understand these instructions.  Will watch your condition.  Will get help right away if you are not doing well or get worse. Document Released: 05/31/2008 Document Revised: 12/18/2013 Document Reviewed: 08/22/2013 [facility] Patient Information 2015 [place], [place]. This information is  not intended to replace advice given to you by your health care provider. Make sure you discuss any questions you have with your health care provider.  Diarrhea Diarrhea is watery poop (stool). It can make you feel weak, tired, thirsty, or give you a dry mouth (signs of dehydration). Watery poop is a sign of another problem, most often an infection. It often lasts 2-3 days. It can last  longer if it is a sign of something serious. Take care of yourself as told by your doctor. HOME CARE   Drink 1 cup (8 ounces) of fluid each time you have watery poop.  Do not drink the following fluids:  Those that contain simple sugars (fructose, glucose, galactose, lactose, sucrose, maltose).  Sports drinks.  Fruit juices.  Whole milk products.  Sodas.  Drinks with caffeine (coffee, tea, soda) or alcohol.  Oral rehydration solution may be used if the doctor says it is okay. You may make your own solution. Follow this recipe:   - teaspoon table salt.   teaspoon baking soda.   teaspoon salt substitute containing potassium chloride.  1 tablespoons sugar.  1 liter (34 ounces) of water.  Avoid the following foods:  High fiber foods, such as raw fruits and vegetables.  Nuts, seeds, and whole grain breads and cereals.   Those that are sweetened with sugar alcohols (xylitol, sorbitol, mannitol).  Try eating the following foods:  Starchy foods, such as rice, toast, pasta, low-sugar cereal, oatmeal, baked potatoes, crackers, and bagels.  Bananas.  Applesauce.  Eat probiotic-rich foods, such as yogurt and milk products that are fermented.  Wash your hands well after each time you have watery poop.  Only take medicine as told by your doctor.  Take a warm bath to help lessen burning or pain from having watery poop. GET HELP RIGHT AWAY IF:   You cannot drink fluids without throwing up (vomiting).  You keep throwing up.  You have blood in your poop, or your poop looks black and tarry.  You do not pee (urinate) in 6-8 hours, or there is only a small amount of very dark pee.  You have belly (abdominal) pain that gets worse or stays in the same spot (localizes).  You are weak, dizzy, confused, or light-headed.  You have a very bad headache.  Your watery poop gets worse or does not get better.  You have a fever or lasting symptoms for more than 2-3 days.  You  have a fever and your symptoms suddenly get worse. MAKE SURE YOU:   Understand these instructions.  Will watch your condition.  Will get help right away if you are not doing well or get worse. Document Released: 05/31/2008 Document Revised: 04/29/2014 Document Reviewed: 08/20/2012 [facility] Patient Information 2015 [place], [place]. This information is not intended to replace advice given to you by your health care provider. Make sure you discuss any questions you have with your health care provider.  Nausea, Adult Nausea is the feeling that you have an upset stomach or have to vomit. Nausea by itself is not likely a serious concern, but it may be an early sign of more serious medical problems. As nausea gets worse, it can lead to vomiting. If vomiting develops, there is the risk of dehydration.  CAUSES   Viral infections.  Food poisoning.  Medicines.  Pregnancy.  Motion sickness.  Migraine headaches.  Emotional distress.  Severe pain from any source.  Alcohol intoxication. HOME CARE INSTRUCTIONS  Get plenty of rest.  Ask  your caregiver about specific rehydration instructions.  Eat small amounts of food and sip liquids more often.  Take all medicines as told by your caregiver. SEEK MEDICAL CARE IF:  You have not improved after 2 days, or you get worse.  You have a headache. SEEK IMMEDIATE MEDICAL CARE IF:   You have a fever.  You faint.  You keep vomiting or have blood in your vomit.  You are extremely weak or dehydrated.  You have dark or bloody stools.  You have severe chest or abdominal pain. MAKE SURE YOU:  Understand these instructions.  Will watch your condition.  Will get help right away if you are not doing well or get worse. Document Released: 01/20/2005 Document Revised: 09/06/2012 Document Reviewed: 08/25/2011 [facility] Patient Information 2015 [place], [place]. This information is not intended to replace advice given to you by your health  care provider. Make sure you discuss any questions you have with your health care provider. Emergency Department Resource Guide 1) Find a Doctor and Pay Out of Pocket Although you won't have to find out who is covered by your insurance plan, it is a good idea to ask around and get recommendations. You will then need to call the office and see if the doctor you have chosen will accept you as a new patient and what types of options they offer for patients who are self-pay. Some doctors offer discounts or will set up payment plans for their patients who do not have insurance, but you will need to ask so you aren't surprised when you get to your appointment.  2) Contact Your Local Health Department Not all health departments have doctors that can see patients for sick visits, but many do, so it is worth a call to see if yours does. If you don't know where your local health department is, you can check in your phone book. The CDC also has a tool to help you locate your state's health department, and many state websites also have listings of all of their local health departments.  3) Find a Walk-in Clinic If your illness is not likely to be very severe or complicated, you may want to try a walk in clinic. These are popping up all over the country in pharmacies, drugstores, and shopping centers. They're usually staffed by nurse practitioners or physician assistants that have been trained to treat common illnesses and complaints. They're usually fairly quick and inexpensive. However, if you have serious medical issues or chronic medical problems, these are probably not your best option.  No Primary Care Doctor: - Call Health Connect at  [phone] - they can help you locate a primary care doctor that  accepts your insurance, provides certain services, etc. - Physician Referral Service- [phone]  Chronic Pain Problems: Organization         Address  Phone   Notes  [facility]  [phone] Patients need to be referred by their primary care doctor.   Medication Assistance: Organization         Address  Phone   Notes  [facility] Medication [facility] [address] [place], [place] [address] [phone] --Must be a resident of [facility] -- Must have NO insurance coverage whatsoever (no Medicaid/ Medicare, etc.) -- The pt. MUST have a primary care doctor that directs their care regularly and follows them in the community   [facility]  [phone]   [address]  [phone]  Agencies that provide inexpensive medical care: Organization         Address  Phone   Notes  [facility]  [phone]   [facility]    [phone]) [phone]   [facility] [address] [place], [place] [address] [phone]   [facility] [address], [place] [phone]) [phone]   [facility]    [phone]   [facility]    [phone]   [facility]  [address], [place] Phone:  [phone], Fax:  [phone]) [phone] Hours of Operation:  9 am - 6 pm, M-F.  Also accepts Medicaid/Medicare and self-pay.  [facility]  [address], [place] Phone: [phone], Fax: [phone]) [phone]. Hours of Operation:  8:30 am - 5:30 pm, M-F.  Also accepts Medicaid and self-pay.  [facility] [place] [address], [place] Phone: [phone]) [phone]   [facility] [address], [place], [place] [phone], Ext. [phone] Mondays & Thursdays: 7-9 AM.  First 15 patients are seen on a first come, first serve basis.    Medicaid-accepting [facility] Providers:  Organization         Address  Phone   Notes  [facility] [address],  [phone] Also accepts self-pay patients.  [facility] [address], [place]   [phone]) [phone]   [facility] [address], [place] [phone]   [facility] [address] [place] [address], [place] [phone]) [phone]   Renaye RakersVeita Bland [address], [place]   [phone]) [phone] Only accepts [place] Access [place] patients after they have their name applied to their card.   Self-Pay (no insurance) in [facility]:  Organization         Address  Phone   Notes  Sickle Cell Patients, [facility] [address], [place] [phone]   [facility] [address], [place] [phone]) [phone]   [facility] [place]  [address], [place] [phone]   [facility]/Dr. Osei-Bonsu  [address] [place] [address], [place] or [id number], [place] [phone] Phone number for both [place] and [place] locations is the same.  [facility] [address], [place] [phone]   [facility]  [address] [place] [address], [place] or [address] [phone]   [facility] [address], [place] [phone], phone; [phone], fax Sees patients 1st and 3rd Saturday of every month.  Must not qualify for public or private insurance (i.e. Medicaid, Medicare,  Health Choice, Veterans' Benefits)  Household income should be no more than 200% of the poverty level The clinic cannot treat you if you are pregnant or think you are pregnant  Sexually transmitted diseases are not treated at the clinic.

## 2014-10-13 NOTE — ED Notes (Signed)
Pt reports abd pains and diarrhea x 2 days. Denies n/v. No acute distress noted at triage.

## 2014-10-14 NOTE — ED Provider Notes (Signed)
Medical screening examination/treatment/procedure(s) were performed by non-physician practitioner and as supervising physician I was immediately available for consultation/collaboration.   EKG Interpretation None       Flint MelterElliott L Zacharias Ridling, MD 10/14/14 1140

## 2014-10-16 LAB — URINE CULTURE: Colony Count: 85000

## 2014-10-16 NOTE — Progress Notes (Signed)
ED Antimicrobial Stewardship Positive Culture Follow Up   Domenic PoliteMarissa Rasmussen is an 23 y.o. female who [facility] with a chief complaint of  Chief Complaint  Patient presents with  . Diarrhea  . Abdominal Pain    Recent Results (from the past 720 hour(s))  URINE CULTURE     Status: None   Collection Time    10/13/14  8:51 PM      Result Value Ref Range Status   Specimen Description URINE, CLEAN CATCH   Final   Special Requests CX ADDED AT 0610 ON [id number]   Final   Culture  Setup Time     Final   Value: 10/14/2014 08:51     [facility] at [facility]   Colony Count     Final   Value: 85,000 COLONIES/ML     [facility]formed at [facility]   Culture     Final   Value: ESCHERICHIA COLI     Performed at [facility]   Report Status 10/16/2014 FINAL   Final   Organism ID, Bacteria ESCHERICHIA COLI   Final   Patient presented to ED with diarrhea and abdominal pain. No urinary symptoms reported. Urine analysis most likely contaminated - no treatment indicated.  ED Provider: Mellody DrownLauren Parker, PA-C   Cleon DewDulaney, [place] Robert 10/16/2014, 10:18 AM Infectious Diseases Pharmacist Phone# [phone]

## 2014-10-17 ENCOUNTER — Telehealth (HOSPITAL_COMMUNITY): Payer: Self-pay

## 2014-10-17 NOTE — ED Notes (Signed)
Post ED Visit - Positive Culture Follow-up  Culture report reviewed by antimicrobial stewardship pharmacist: [x]  Wes Dulaney, Pharm.D., BCPS []  Celedonio MiyamotoJeremy Frens, Pharm.D., BCPS []  Georgina PillionElizabeth Martin, Pharm.D., BCPS []  [place] Pham, [place]arm.D., BCPS, AAHIVP []  Estella HuskMichelle Turner, Pharm.D., BCPS, AAHIVP []  Carly Sabat, Pharm.D. []  Enzo BiNathan Batchelder, [address]Pharm.D.  Positive urine culture  no further patient follow-up is required at this time.  Ashley JacobsFesterman, Chantavia Bazzle C 10/17/2014, 7:32 AM

## 2014-10-28 ENCOUNTER — Encounter (HOSPITAL_COMMUNITY): Payer: Self-pay | Admitting: Emergency Medicine

## 2015-02-14 ENCOUNTER — Encounter (HOSPITAL_COMMUNITY): Payer: Self-pay | Admitting: *Deleted

## 2015-02-14 ENCOUNTER — Emergency Department (HOSPITAL_COMMUNITY)
Admission: EM | Admit: 2015-02-14 | Discharge: 2015-02-14 | Disposition: A | Discharge status: Home / Self Care | Payer: Medicaid Other | Attending: Emergency Medicine | Admitting: Emergency Medicine

## 2015-02-14 ENCOUNTER — Emergency Department (HOSPITAL_COMMUNITY): Payer: Medicaid Other

## 2015-02-14 DIAGNOSIS — Y998 Other external cause status: Secondary | ICD-10-CM | POA: Diagnosis not present

## 2015-02-14 DIAGNOSIS — M79601 Pain in right arm: Secondary | ICD-10-CM

## 2015-02-14 DIAGNOSIS — S4991XA Unspecified injury of right shoulder and upper arm, initial encounter: Secondary | ICD-10-CM | POA: Diagnosis not present

## 2015-02-14 DIAGNOSIS — Y92410 Unspecified street and highway as the place of occurrence of the external cause: Secondary | ICD-10-CM | POA: Diagnosis not present

## 2015-02-14 DIAGNOSIS — Y9389 Activity, other specified: Secondary | ICD-10-CM | POA: Diagnosis not present

## 2015-02-14 DIAGNOSIS — S6991XA Unspecified injury of right wrist, hand and finger(s), initial encounter: Secondary | ICD-10-CM | POA: Diagnosis not present

## 2015-02-14 DIAGNOSIS — S59901A Unspecified injury of right elbow, initial encounter: Secondary | ICD-10-CM | POA: Diagnosis not present

## 2015-02-14 MED ORDER — NAPROXEN 500 MG PO TABS: 500.0000 mg | ORAL_TABLET | Freq: Two times a day (BID) | ORAL | Status: DC
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
## 2015-02-14 NOTE — Discharge Instructions (Signed)
Take the prescribed medication as directed.  Arm may continue to be sore for the next few days. Return to the ED for new or worsening symptoms.
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
## 2016-03-11 ENCOUNTER — Emergency Department (INDEPENDENT_AMBULATORY_CARE_PROVIDER_SITE_OTHER)
Admission: EM | Admit: 2016-03-11 | Discharge: 2016-03-11 | Disposition: A | Discharge status: Home / Self Care | Payer: Medicaid Other | Source: Home / Self Care | Attending: Family Medicine | Admitting: Family Medicine

## 2016-03-11 ENCOUNTER — Encounter (HOSPITAL_COMMUNITY): Payer: Self-pay | Admitting: *Deleted

## 2016-03-11 DIAGNOSIS — S91311A Laceration without foreign body, right foot, initial encounter: Secondary | ICD-10-CM | POA: Diagnosis not present

## 2016-03-11 DIAGNOSIS — Z23 Encounter for immunization: Secondary | ICD-10-CM | POA: Diagnosis not present

## 2016-03-11 MED ORDER — TETANUS-DIPHTH-ACELL PERTUSSIS 5-2.5-18.5 LF-MCG/0.5 IM SUSP
0.5000 mL | Freq: Once | INTRAMUSCULAR | Status: AC
Start: 2016-03-11 — End: 2016-03-11
  Administered 2016-03-11: 0.5 mL via INTRAMUSCULAR

## 2016-03-11 MED ORDER — TETANUS-DIPHTH-ACELL PERTUSSIS 5-2.5-18.5 LF-MCG/0.5 IM SUSP
INTRAMUSCULAR | Status: AC
  Filled 2016-03-11: qty 0.5

## 2016-03-11 NOTE — Discharge Instructions (Signed)
Wash regularly and cover as needed, return if any problems.

## 2016-03-11 NOTE — ED Notes (Addendum)
Pt  Reports  She  Stepped  On  A  Christmas tree  Ornament  sev  hours  Ago  And  Sustained  A  Laceration to  The  Bottom of   Her  Foot   - bleeding  Subsided       Unknown  As  To  When her  Last  Tetanus

## 2016-03-11 NOTE — ED Provider Notes (Signed)
CSN: [id number]     Arrival date & time 03/11/16  1356 History   First MD Initiated Contact with Patient 03/11/16 1555     Chief Complaint  Patient presents with  . Extremity Laceration   (Consider location/radiation/quality/duration/timing/severity/associated sxs/prior Treatment) Patient is a 25 y.o. female presenting with skin laceration. The history is provided by the patient.  Laceration Location:  Foot Foot laceration location:  Sole of R foot Length (cm):  1 Depth:  Cutaneous Quality: straight   Bleeding: controlled   Time since incident:  3 hours Laceration mechanism:  Broken glass (stepped on broken Christmas tree ornament) Pain details:    Quality:  Sharp   Severity:  Mild Foreign body present:  No foreign bodies Relieved by:  Nothing Worsened by:  Nothing tried Tetanus status:  Out of date   Past Medical History  Diagnosis Date  . No pertinent past medical history   . SVD (spontaneous vaginal delivery) 05/06/2012   Past Surgical History  Procedure Laterality Date  . Hernia repair      bilateral at 4 mos   Family History  Problem Relation Age of Onset  . Anxiety disorder Mother   . Arthritis Mother   . Depression Mother   . Cancer Maternal Grandmother     breast  . Stroke Paternal Grandmother   . Heart attack Paternal Grandfather    Social History  Substance Use Topics  . Smoking status: Never Smoker   . Smokeless tobacco: None  . Alcohol Use: No   OB History    Gravida Para Term Preterm AB TAB SAB Ectopic Multiple Living   1 1 1       1      Review of Systems  Skin: Positive for wound.  All other systems reviewed and are negative.   Allergies  Review of patient's allergies indicates no known allergies.  Home Medications   Prior to Admission medications   Medication Sig Start Date End Date Taking? Authorizing Provider  HYDROcodone-acetaminophen (HYCET) 7.5-325 mg/15 ml solution Take 15 mLs (7.5 mg of hydrocodone total) by mouth every 6  (six) hours as needed for severe pain. 10/13/14   Mercedes Camprubi-Soms, PA-C  ibuprofen (CHILD IBUPROFEN) 100 MG/5ML suspension Take 20 mLs (400 mg total) by mouth every 6 (six) hours as needed for fever, mild pain or moderate pain. 10/13/14   Mercedes Camprubi-Soms, PA-C  naproxen (NAPROSYN) 500 MG tablet Take 1 tablet (500 mg total) by mouth 2 (two) times daily with a meal. 02/14/15   Garlon HatchetLisa M Sanders, PA-C  ondansetron (ZOFRAN ODT) 4 MG disintegrating tablet Take 1 tablet (4 mg total) by mouth every 8 (eight) hours as needed for nausea or vomiting. 10/13/14   Mercedes Camprubi-Soms, PA-C   Meds Ordered and Administered this Visit   Medications  Tdap (BOOSTRIX) injection 0.5 mL (not administered)    BP 124/82 mmHg  Pulse 64  Temp(Src) 98 F (36.7 C)  Resp 16  SpO2 98% No data found.   Physical Exam  Constitutional: She is oriented to person, place, and time. She appears well-developed and well-nourished. No distress.  Neck: Normal range of motion. Neck supple.  Musculoskeletal: She exhibits no tenderness.  Lymphadenopathy:    She has no cervical adenopathy.  Neurological: She is alert and oriented to person, place, and time.  Skin: Skin is warm and dry.  1cm nonbleeding lac to plantar aspect of right foot, no bleeding, no fb evident.  Nursing note and vitals reviewed.   ED Course  Procedures (including critical care time)  Labs Review Labs Reviewed - No data to display  Imaging Review No results found.   Visual Acuity Review  Right Eye Distance:   Left Eye Distance:   Bilateral Distance:    Right Eye Near:   Left Eye Near:    Bilateral Near:         MDM   1. Laceration of foot, right, initial encounter       Linna Hoff, MD 03/11/16 1609

## 2016-07-12 ENCOUNTER — Emergency Department (HOSPITAL_COMMUNITY)
Admission: EM | Admit: 2016-07-12 | Discharge: 2016-07-12 | Disposition: A | Discharge status: Home / Self Care | Payer: Medicaid Other | Attending: Emergency Medicine | Admitting: Emergency Medicine

## 2016-07-12 ENCOUNTER — Encounter (HOSPITAL_COMMUNITY): Payer: Self-pay | Admitting: Emergency Medicine

## 2016-07-12 DIAGNOSIS — F1210 Cannabis abuse, uncomplicated: Secondary | ICD-10-CM | POA: Insufficient documentation

## 2016-07-12 DIAGNOSIS — F1910 Other psychoactive substance abuse, uncomplicated: Secondary | ICD-10-CM | POA: Insufficient documentation

## 2016-07-12 DIAGNOSIS — Z79899 Other long term (current) drug therapy: Secondary | ICD-10-CM | POA: Insufficient documentation

## 2016-07-12 DIAGNOSIS — R002 Palpitations: Secondary | ICD-10-CM | POA: Insufficient documentation

## 2016-07-12 LAB — URINE MICROSCOPIC-ADD ON

## 2016-07-12 LAB — URINALYSIS, ROUTINE W REFLEX MICROSCOPIC
BILIRUBIN URINE: NEGATIVE
GLUCOSE, UA: NEGATIVE mg/dL
HGB URINE DIPSTICK: NEGATIVE
Ketones, ur: NEGATIVE mg/dL
Nitrite: NEGATIVE
PH: 5.5 (ref 5.0–8.0)
Protein, ur: 30 mg/dL — AB
SPECIFIC GRAVITY, URINE: 1.04 — AB (ref 1.005–1.030)

## 2016-07-12 LAB — RAPID URINE DRUG SCREEN, HOSP PERFORMED
AMPHETAMINES: NOT DETECTED
BENZODIAZEPINES: NOT DETECTED
Barbiturates: NOT DETECTED
COCAINE: POSITIVE — AB
OPIATES: NOT DETECTED
Tetrahydrocannabinol: POSITIVE — AB

## 2016-07-12 LAB — PREGNANCY, URINE: Preg Test, Ur: NEGATIVE

## 2016-07-12 MED ORDER — SODIUM CHLORIDE 0.9 % IV BOLUS (SEPSIS)
1000.0000 mL | Freq: Once | INTRAVENOUS | Status: AC
  Administered 2016-07-12: 1000 mL via INTRAVENOUS

## 2016-07-12 MED ORDER — LORAZEPAM 2 MG/ML IJ SOLN
1.0000 mg | Freq: Once | INTRAMUSCULAR | Status: AC
  Administered 2016-07-12: 1 mg via INTRAVENOUS
  Filled 2016-07-12: qty 1

## 2016-07-12 NOTE — Discharge Instructions (Signed)
Palpitations A palpitation is the feeling that your heartbeat is irregular or is faster than normal. It may feel like your heart is fluttering or skipping a beat. Palpitations are usually not a serious problem. However, in some cases, you may need further medical evaluation. CAUSES  Palpitations can be caused by:  Smoking.  Caffeine or other stimulants, such as diet pills or energy drinks.  Alcohol.  Stress and anxiety.  Strenuous physical activity.  Fatigue.  Certain medicines.  Heart disease, especially if you have a history of irregular heart rhythms (arrhythmias), such as atrial fibrillation, atrial flutter, or supraventricular tachycardia.  An improperly working pacemaker or defibrillator. DIAGNOSIS  To find the cause of your palpitations, your health care provider will take your medical history and perform a physical exam. Your health care provider may also have you take a test called an ambulatory electrocardiogram (ECG). An ECG records your heartbeat patterns over a 24-hour period. You may also have other tests, such as:  Transthoracic echocardiogram (TTE). During echocardiography, sound waves are used to evaluate how blood flows through your heart.  Transesophageal echocardiogram (TEE).  Cardiac monitoring. This allows your health care provider to monitor your heart rate and rhythm in real time.  Holter monitor. This is a portable device that records your heartbeat and can help diagnose heart arrhythmias. It allows your health care provider to track your heart activity for several days, if needed.  Stress tests by exercise or by giving medicine that makes the heart beat faster. TREATMENT  Treatment of palpitations depends on the cause of your symptoms and can vary greatly. Most cases of palpitations do not require any treatment other than time, relaxation, and monitoring your symptoms. Other causes, such as atrial fibrillation, atrial flutter, or supraventricular  tachycardia, usually require further treatment. HOME CARE INSTRUCTIONS   Avoid:  Caffeinated coffee, tea, soft drinks, diet pills, and energy drinks.  Chocolate.  Alcohol.  Stop smoking if you smoke.  Reduce your stress and anxiety. Things that can help you relax include:  A method of controlling things in your body, such as your heartbeats, with your mind (biofeedback).  Yoga.  Meditation.  Physical activity such as swimming, jogging, or walking.  Get plenty of rest and sleep. SEEK MEDICAL CARE IF:   You continue to have a fast or irregular heartbeat beyond 24 hours.  Your palpitations occur more often. SEEK IMMEDIATE MEDICAL CARE IF:  You have chest pain or shortness of breath.  You have a severe headache.  You feel dizzy or you faint. MAKE SURE YOU:  Understand these instructions.  Will watch your condition.  Will get help right away if you are not doing well or get worse.   This information is not intended to replace advice given to you by your health care provider. Make sure you discuss any questions you have with your health care provider.   Document Released: 12/10/2000 Document Revised: 12/18/2013 Document Reviewed: 02/11/2012 Elsevier Interactive Patient Education 2016 [employer] Polysubstance Abuse When people abuse more than one drug or type of drug it is called polysubstance or polydrug abuse. For example, many smokers also drink alcohol. This is one form of polydrug abuse. Polydrug abuse also refers to the use of a drug to counteract an unpleasant effect produced by another drug. It may also be used to help with withdrawal from another drug. People who take stimulants may become agitated. Sometimes this agitation is countered with a tranquilizer. This helps protect against the unpleasant side effects. Polydrug  abuse also refers to the use of different drugs at the same time.  Anytime drug use is interfering with normal living activities, it has  become abuse. This includes problems with family and friends. Psychological dependence has developed when your mind tells you that the drug is needed. This is usually followed by physical dependence which has developed when continuing increases of drug are required to get the same feeling or "high". This is known as addiction or chemical dependency. A person's risk is much higher if there is a history of chemical dependency in the family. SIGNS OF CHEMICAL DEPENDENCY  You have been told by friends or family that drugs have become a problem.  You fight when using drugs.  You are having blackouts (not remembering what you do while using).  You feel sick from using drugs but continue using.  You lie about use or amounts of drugs (chemicals) used.  You need chemicals to get you going.  You are suffering in work performance or in school because of drug use.  You get sick from use of drugs but continue to use anyway.  You need drugs to relate to people or feel comfortable in social situations.  You use drugs to forget problems. "Yes" answered to any of the above signs of chemical dependency indicates there are problems. The longer the use of drugs continues, the greater the problems will become. If there is a family history of drug or alcohol use, it is best not to experiment with these drugs. Continual use leads to tolerance. After tolerance develops more of the drug is needed to get the same feeling. This is followed by addiction. With addiction, drugs become the most important part of life. It becomes more important to take drugs than participate in the other usual activities of life. This includes relating to friends and family. Addiction is followed by dependency. Dependency is a condition where drugs are now needed not just to get high, but to feel normal. Addiction cannot be cured but it can be stopped. This often requires outside help and the care of professionals. Treatment centers are  listed in the yellow pages under: Cocaine, Narcotics, and Alcoholics Anonymous. Most hospitals and clinics can refer you to a specialized care center. Talk to your caregiver if you need help.   This information is not intended to replace advice given to you by your health care provider. Make sure you discuss any questions you have with your health care provider.   Document Released: 08/04/2005 Document Revised: 03/06/2012 Document Reviewed: 12/18/2014 Elsevier Interactive Patient Education [employer]

## 2016-07-12 NOTE — ED Provider Notes (Signed)
CSN: [id number]     Arrival date & time 07/12/16  0631 History   First MD Initiated Contact with Patient 07/12/16 [phone]     Chief Complaint  Patient presents with  . Tachycardia     (Consider location/radiation/quality/duration/timing/severity/associated sxs/prior Treatment) HPI  This is a 25 year old female who presents with palpitations. Patient reports smoking marijuana at approximately 5 AM. She states that she took several puffs and began to feel palpitations. She denies chest pain or shortness of breath. She does not regularly smoke marijuana. She denies any other drug use including cocaine, amphetamines, heroin.  Past Medical History  Diagnosis Date  . No pertinent past medical history   . SVD (spontaneous vaginal delivery) 05/06/2012   Past Surgical History  Procedure Laterality Date  . Hernia repair      bilateral at 4 mos   Family History  Problem Relation Age of Onset  . Anxiety disorder Mother   . Arthritis Mother   . Depression Mother   . Cancer Maternal Grandmother     breast  . Stroke Paternal Grandmother   . Heart attack Paternal Grandfather    Social History  Substance Use Topics  . Smoking status: Never Smoker   . Smokeless tobacco: Not on file  . Alcohol Use: No   OB History    Gravida Para Term Preterm AB TAB SAB Ectopic Multiple Living   1 1 1       1      Review of Systems  Respiratory: Negative for shortness of breath.   Cardiovascular: Positive for palpitations. Negative for chest pain.  Gastrointestinal: Negative for nausea and vomiting.  All other systems reviewed and are negative.     Allergies  Review of patient's allergies indicates no known allergies.  Home Medications   Prior to Admission medications   Medication Sig Start Date End Date Taking? Authorizing Provider  etonogestrel (NEXPLANON) 68 MG IMPL implant 1 each by Subdermal route once. Placed 2013   Yes Historical Provider, MD   BP 118/54 mmHg  Pulse 116  Temp(Src) 98.1  F (36.7 C) (Oral)  Resp 22  SpO2 100% Physical Exam  Constitutional: She is oriented to person, place, and time. She appears well-developed and well-nourished.  HENT:  Head: Normocephalic and atraumatic.  Eyes: Pupils are equal, round, and reactive to light.  Pupils 4 mm reactive bilaterally  Cardiovascular: Regular rhythm and normal heart sounds.   No murmur heard. Tachycardia  Pulmonary/Chest: Effort normal and breath sounds normal. No respiratory distress. She has no wheezes.  Abdominal: Soft. There is no tenderness.  Neurological: She is alert and oriented to person, place, and time.  Skin: Skin is warm and dry.  Psychiatric: She has a normal mood and affect.  Nursing note and vitals reviewed.   ED Course  Procedures (including critical care time) Labs Review Labs Reviewed  URINE RAPID DRUG SCREEN, HOSP PERFORMED - Abnormal; Notable for the following:    Cocaine POSITIVE (*)    Tetrahydrocannabinol POSITIVE (*)    All other components within normal limits  URINALYSIS, ROUTINE W REFLEX MICROSCOPIC ([facility]) - Abnormal; Notable for the following:    Color, Urine AMBER (*)    APPearance CLOUDY (*)    Specific Gravity, Urine 1.040 (*)    Protein, ur 30 (*)    Leukocytes, UA SMALL (*)    All other components within normal limits  URINE MICROSCOPIC-ADD ON - Abnormal; Notable for the following:    Squamous Epithelial / LPF 6-30 (*)  Bacteria, UA MANY (*)    All other components within normal limits  PREGNANCY, URINE    Imaging Review No results found. I have personally reviewed and evaluated these images and lab results as part of my medical decision-making.   EKG Interpretation   Date/Time:  Monday July 12 2016 06:43:22 EDT Ventricular Rate:  139 PR Interval:    QRS Duration: 91 QT Interval:  254 QTC Calculation: 387 R Axis:   95 Text Interpretation:  Sinus tachycardia Ventricular premature complex  Borderline right axis deviation Borderline T wave  abnormalities No prior  for comparison Confirmed by Jamey Demchak  MD, Toni Amend ([id number]) on 07/12/2016  6:45:49 AM Also confirmed by Wilkie Aye  MD, Shalisha Clausing ([id number]), editor  [place]  [facility], [place] ([id number])  on 07/12/2016 7:29:32 AM      MDM   Final diagnoses:  Palpitations  Polysubstance abuse  Patient presents with palpitations following smoking marijuana. She is tachycardic in the 140s. It appears to be sinus tach. She denies any other ingestion. She is not syndromic.  Patient was given fluids and Ativan. UDS positive also for cocaine.  8:36 AM On recheck, patient reports improvement of symptoms. Heart rate now 106. Informed that she was cocaine positive. She still denies intentional cocaine use. I discussed with the patient that she needs to be careful regarding illicit drug use as it is not controlled. Patient stated understanding.    Shon Baton, MD 07/12/16 [phone]

## 2016-07-12 NOTE — ED Notes (Signed)
Bed: [id number] Expected date:  Expected time:  Means of arrival:  Comments: EMS 25 yo female with elevated HR after smoking marijuana

## 2016-07-12 NOTE — ED Notes (Signed)
Per EMS pt reports to have used Marijuana today around 5am. PT then stated she began feeling like her heart was racing after 4 puffs. Pt A&O X4 no acute distress. IV access to left AC.

## 2016-11-25 ENCOUNTER — Ambulatory Visit (INDEPENDENT_AMBULATORY_CARE_PROVIDER_SITE_OTHER): Payer: Worker's Compensation | Admitting: Family Medicine

## 2016-11-25 ENCOUNTER — Ambulatory Visit (INDEPENDENT_AMBULATORY_CARE_PROVIDER_SITE_OTHER): Payer: Worker's Compensation

## 2016-11-25 VITALS — BP 144/68 | HR 92 | Temp 98.3°F | Resp 18 | Ht 72.0 in | Wt 236.0 lb

## 2016-11-25 DIAGNOSIS — M549 Dorsalgia, unspecified: Secondary | ICD-10-CM

## 2016-11-25 DIAGNOSIS — M79602 Pain in left arm: Secondary | ICD-10-CM

## 2016-11-25 MED ORDER — NAPROXEN 500 MG PO TABS: 500.0000 mg | ORAL_TABLET | Freq: Two times a day (BID) | ORAL | 0 refills | Status: DC

## 2016-11-25 NOTE — Progress Notes (Signed)
Lindsey PoliteMarissa Rasmussen 1991/02/28 25 y.o.   Chief Complaint  Patient presents with  . workers comp    back injury    Presents for evaluation of work-related complaint.  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  Her back was facing the door of the elevator and upon the opening of the door, she began to step backwards pulling the cart towards her, and the elevator door closed injuring her left lateral upper arm and back. She reported incident to manager and was advised to immediately seek medical care. Reports pain as "aching" of the left side of back and lower arm. She hasn't taken any anti-inflammatory medication.  To her knowledge she has no bruising or scarring from impact of the elevator.  ROS See HPI  Current medications and allergies reviewed and updated. Past medical history, family history, social history have been reviewed and updated.   Physical Exam  Constitutional: She is well-developed, well-nourished, and in no distress.  HENT:  Head: Normocephalic and atraumatic.  Right Ear: External ear normal.  Left Ear: External ear normal.  Cardiovascular: Normal rate, regular rhythm, normal heart sounds and intact distal pulses.   Pulmonary/Chest: Effort normal and breath sounds normal.  Musculoskeletal:  Left arm Tenderness with rotational arm movements Left arm Limited extension to 90 degrees prior to producing patient reported pain.      Vitals:   11/25/16 1304  BP: (!) 144/68  Pulse: 92  Resp: 18  Temp: 98.3 F (36.8 C)   Assessment and Plan: 1. Arm pain, lateral, left - DG Humerus Left 2. Bilateral back pain, unspecified back location, unspecified chronicity - DG Lumbar Spine Complete  Plan:  Naproxen 500 mg twice daily with food as needed for pain.  Return for follow-up if symptoms  worsen or do not improve.  Godfrey PickKimberly S. Tiburcio PeaHarris, MSN, FNP-C [facility]

## 2016-11-25 NOTE — Patient Instructions (Addendum)
Take Naproxen 500 mg twice daily with food as needed for pain.  Take Tylenol for pain, 500 mg every 4 hours as needed.  Return for care if symptoms worsen or do not improve.   IF you received an x-ray today, you will receive an invoice from [facility]. Please contact [facility] at [phone] with questions or concerns regarding your invoice.   IF you received labwork today, you will receive an invoice from [facility]. Please contact [facility] at [phone] with questions or concerns regarding your invoice.   Our billing staff will not be able to assist you with questions regarding bills from these companies.  You will be contacted with the lab results as soon as they are available. The fastest way to get your results is to activate your My Chart account. Instructions are located on the last page of this paperwork. If you have not heard from us regarding the results in 2 weeks, please contact this office.

## 2016-12-02 ENCOUNTER — Other Ambulatory Visit: Payer: Self-pay

## 2017-01-25 ENCOUNTER — Encounter (HOSPITAL_COMMUNITY): Payer: Self-pay | Admitting: Emergency Medicine

## 2017-01-25 ENCOUNTER — Emergency Department (HOSPITAL_COMMUNITY)
Admission: EM | Admit: 2017-01-25 | Discharge: 2017-01-25 | Disposition: A | Discharge status: Home / Self Care | Payer: Medicaid Other | Attending: Emergency Medicine | Admitting: Emergency Medicine

## 2017-01-25 DIAGNOSIS — R69 Illness, unspecified: Secondary | ICD-10-CM

## 2017-01-25 DIAGNOSIS — J111 Influenza due to unidentified influenza virus with other respiratory manifestations: Secondary | ICD-10-CM | POA: Insufficient documentation

## 2017-01-25 DIAGNOSIS — Z79899 Other long term (current) drug therapy: Secondary | ICD-10-CM | POA: Insufficient documentation

## 2017-01-25 DIAGNOSIS — R05 Cough: Secondary | ICD-10-CM | POA: Diagnosis present

## 2017-01-25 MED ORDER — BENZONATATE 100 MG PO CAPS: 100.0000 mg | ORAL_CAPSULE | Freq: Three times a day (TID) | ORAL | 0 refills | Status: AC

## 2017-01-25 MED ORDER — ONDANSETRON 4 MG PO TBDP: 4.0000 mg | ORAL_TABLET | Freq: Three times a day (TID) | ORAL | 0 refills | Status: AC | PRN

## 2017-01-25 MED ORDER — IBUPROFEN 800 MG PO TABS: 800.0000 mg | ORAL_TABLET | Freq: Three times a day (TID) | ORAL | 0 refills | Status: DC

## 2017-01-25 NOTE — ED Triage Notes (Signed)
Patient has a cough, chilis, and low grade fever.  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  Denies N/V/D.

## 2017-01-25 NOTE — Discharge Instructions (Signed)
You likely have a virus, possibly flu.   Take the prescribed medication as directed.  These should help with your symptoms.  Rest and drink fluids. Follow-up with your primary care doctor. Return to the ED for new or worsening symptoms.

## 2017-01-25 NOTE — ED Provider Notes (Signed)
[facility]-EMERGENCY DEPT Provider Note   CSN: [id number] Arrival date & time: 01/25/17  0909   By signing my name below, I, Soijett Blue, attest that this documentation has been prepared under the direction and in the presence of Sharilyn Sites, PA-C Electronically Signed: Soijett Blue, ED Scribe. 01/25/17. 10:38 AM.  History   Chief Complaint Chief Complaint  Patient presents with  . Cough  . Influenza    HPI Lindsey Rasmussen is a 26 y.o. female who presents to the Emergency Department complaining of sudden onset productive cough x yellow sputum onset yesterday. Pt reports associated subjective fever, generalized body aches, and sore throat. 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 Pt reports that she did obtain her flu vaccination this past year. Pt has not tried any medications for the relief of her symptoms. She denies nausea, vomiting, and any other symptoms. Denies allergies to medications.  No chest pain or SOB.   The history is provided by the patient. No language interpreter was used.    Past Medical History:  Diagnosis Date  . No pertinent past medical history   . SVD (spontaneous vaginal delivery) 05/06/2012    There are no active problems to display for this patient.   Past Surgical History:  Procedure Laterality Date  . HERNIA REPAIR     bilateral at 4 mos    OB History    Gravida Para Term Preterm AB Living   1 1 1     1    SAB TAB Ectopic Multiple Live Births           1       Home Medications    Prior to Admission medications   Medication Sig Start Date End Date Taking? Authorizing Provider  etonogestrel (NEXPLANON) 68 MG IMPL implant 1 each by Subdermal route once. Placed 2013    Historical Provider, MD  naproxen (NAPROSYN) 500 MG tablet Take 1 tablet (500 mg total) by mouth 2 (two) times daily with a meal. 11/25/16   Doyle Askew, FNP    Family History Family History    Problem Relation Age of Onset  . Anxiety disorder Mother   . Arthritis Mother   . Depression Mother   . Cancer Maternal Grandmother     breast  . Stroke Maternal Grandmother   . Heart disease Maternal Grandmother   . Stroke Paternal Grandmother   . Heart attack Paternal Grandfather   . Heart disease Maternal Grandfather   . Stroke Maternal Grandfather     Social History Social History  Substance Use Topics  . Smoking status: Never Smoker  . Smokeless tobacco: Never Used  . Alcohol use No     Allergies   Patient has no known allergies.   Review of Systems Review of Systems  Constitutional: Positive for chills and fever (subjective).  HENT: Positive for sore throat.   Respiratory: Positive for cough (productive, yellow sputum).   Gastrointestinal: Negative for nausea and vomiting.  Musculoskeletal: Positive for myalgias.  All other systems reviewed and are negative.    Physical Exam Updated Vital Signs BP 121/73 (BP Location: Left Arm)   Pulse 96   Temp 98.1 F (36.7 C) (Oral)   Resp 16   Ht 6' (1.829 m)   Wt 235 lb (106.6 kg)   SpO2 100%   BMI 31.87 kg/m   Physical Exam  Constitutional: She is oriented to person, place, and time. She appears  well-developed and well-nourished.  HENT:  Head: Normocephalic and atraumatic.  Right Ear: Tympanic membrane, external ear and ear canal normal.  Left Ear: Tympanic membrane, external ear and ear canal normal.  Nose: Mucosal edema and rhinorrhea present.  Mouth/Throat: Uvula is midline and mucous membranes are normal. Posterior oropharyngeal erythema present. No oropharyngeal exudate or posterior oropharyngeal edema.  Throat is erythematous without edema or exudates, handling secretions well, normal phonation without stridor  Eyes: Conjunctivae and EOM are normal. Pupils are equal, round, and reactive to light.  Neck: Normal range of motion.  Cardiovascular: Normal rate, regular rhythm and normal heart sounds.  Exam  reveals no gallop and no friction rub.   No murmur heard. Pulmonary/Chest: Effort normal and breath sounds normal. No respiratory distress. She has no wheezes. She has no rales.  Abdominal: Soft. Bowel sounds are normal.  Musculoskeletal: Normal range of motion.  Neurological: She is alert and oriented to person, place, and time.  Skin: Skin is warm and dry.  Psychiatric: She has a normal mood and affect.  Nursing note and vitals reviewed.    ED Treatments / Results  DIAGNOSTIC STUDIES: Oxygen Saturation is 100% on RA, nl by my interpretation.    COORDINATION OF CARE: 10:36 AM Discussed treatment plan with pt at bedside which includes symptomatic treatment and pt agreed to plan.   Procedures Procedures (including critical care time)  Medications Ordered in ED Medications - No data to display   Initial Impression / Assessment and Plan / ED Course  I have reviewed the triage vital signs and the nursing notes.  26 year old female here with flulike symptoms for the past 24 hours. 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 She is afebrile and nontoxic in appearance here. On exam she does have some erythema of the oropharynx without edema, nasal congestion, and rhinorrhea. The lungs are clear without wheezes or rhonchi. She denies any chest pain or shortness of breath. Given she remains in the 48 hour window to start Tamiflu, this was offered, however patient declined. She opted for symptomatic treatment only.  Rx tessalon, motrin, zofran.  Encouraged rest, oral hydration.  Follow-up with PCP.  Work note given.  Discussed plan with patient, he/she acknowledged understanding and agreed with plan of care.  Return precautions given for new or worsening symptoms.  Final Clinical Impressions(s) / ED Diagnoses   Final diagnoses:  Influenza-like illness    New Prescriptions Discharge Medication List as of 01/25/2017 10:47 AM    START taking  these medications   Details  benzonatate (TESSALON) 100 MG capsule Take 1 capsule (100 mg total) by mouth every 8 (eight) hours., Starting Tue 01/25/2017, Print    ibuprofen (ADVIL,MOTRIN) 800 MG tablet Take 1 tablet (800 mg total) by mouth 3 (three) times daily., Starting Tue 01/25/2017, Print    ondansetron (ZOFRAN ODT) 4 MG disintegrating tablet Take 1 tablet (4 mg total) by mouth every 8 (eight) hours as needed for nausea., Starting Tue 01/25/2017, Print       I personally performed the services described in this documentation, which was scribed in my presence. The recorded information has been reviewed and is accurate.   Garlon HatchetLisa M Valeri Sula, PA-C 01/25/17 1107    Gwyneth SproutWhitney Plunkett, MD 01/26/17 2130

## 2017-02-08 ENCOUNTER — Ambulatory Visit (INDEPENDENT_AMBULATORY_CARE_PROVIDER_SITE_OTHER): Payer: Worker's Compensation | Admitting: Family Medicine

## 2017-02-08 VITALS — BP 136/82 | HR 77 | Temp 98.0°F | Resp 18 | Ht 72.0 in | Wt 241.0 lb

## 2017-02-08 DIAGNOSIS — M546 Pain in thoracic spine: Secondary | ICD-10-CM | POA: Diagnosis not present

## 2017-02-08 MED ORDER — TRAMADOL HCL 50 MG PO TABS: 50.0000 mg | ORAL_TABLET | Freq: Three times a day (TID) | ORAL | 0 refills | Status: AC | PRN

## 2017-02-08 MED ORDER — DICLOFENAC SODIUM 3 % EX CREA: 1.0000 "application " | TOPICAL_CREAM | Freq: Three times a day (TID) | CUTANEOUS | 1 refills | Status: AC | PRN

## 2017-02-08 NOTE — Progress Notes (Signed)
Patient ID: Lindsey Rasmussen, female    DOB: 24-Aug-1991, 24 y.o.   MRN: [id number]  PCP: No PCP Per Patient  Chief Complaint  Patient presents with  . Follow-up    Workers comp    Subjective:  HPI 26 year old female presents for follow-up of low back pain which she incurred due to a work related injury back in November. 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 Reports overall improvement of back pain compared to initial injury.  Currently experiences back pain 3 out 7 days per week. Pain varies from mild requiring only rest to moderate in which she reports taking Percocet that she was prescribed from another clinic. Reports that the Percocet caused her significant drowsiness, therefore she stopped taking medication.  She continues work daily and reports that she is able to perform duties of lifting and bending without producing pain. Back pain is localized to middle back at bra line and is aching. She denies leg weakness or issues with passing bowel movement.    Social History   Social History  . Marital status: Single    Spouse name: N/A  . Number of children: N/A  . Years of education: N/A   Occupational History  . Not on file.   Social History Main Topics  . Smoking status: Never Smoker  . Smokeless tobacco: Never Used  . Alcohol use No  . Drug use: Yes    Types: Marijuana  . Sexual activity: Yes    Birth control/ protection: Implant   Other Topics Concern  . Not on file   Social History Narrative  . No narrative on file    Family History  Problem Relation Age of Onset  . Anxiety disorder Mother   . Arthritis Mother   . Depression Mother   . Cancer Maternal Grandmother     breast  . Stroke Maternal Grandmother   . Heart disease Maternal Grandmother   . Stroke Paternal Grandmother   . Heart attack Paternal Grandfather   . Heart disease Maternal Grandfather   . Stroke Maternal Grandfather    Review of Systems See HPI  Prior  to Admission medications   Medication Sig Start Date End Date Taking? Authorizing Provider  benzonatate (TESSALON) 100 MG capsule Take 1 capsule (100 mg total) by mouth every 8 (eight) hours. 01/25/17  Yes Garlon Hatchet, PA-C  etonogestrel (NEXPLANON) 68 MG IMPL implant 1 each by Subdermal route once. Placed 2013   Yes Historical Provider, MD  ibuprofen (ADVIL,MOTRIN) 800 MG tablet Take 1 tablet (800 mg total) by mouth 3 (three) times daily. 01/25/17  Yes Garlon Hatchet, PA-C  naproxen (NAPROSYN) 500 MG tablet Take 1 tablet (500 mg total) by mouth 2 (two) times daily with a meal. 11/25/16  Yes Doyle Askew, FNP  ondansetron (ZOFRAN ODT) 4 MG disintegrating tablet Take 1 tablet (4 mg total) by mouth every 8 (eight) hours as needed for nausea. 01/25/17  Yes Garlon Hatchet, PA-C    Past Medical, Surgical Family and Social History reviewed and updated.    Objective:   Today's Vitals   02/08/17 1540  BP: 136/82  Pulse: 77  Resp: 18  Temp: 98 F (36.7 C)  TempSrc: Oral  SpO2: 99%  Weight: 241 lb (109.3 kg)  Height: 6' (1.829 m)    Wt Readings from Last 3 Encounters:  02/08/17 241 lb (109.3 kg)  01/25/17 235 lb (106.6 kg)  11/25/16 236 lb (107 kg)  Physical Exam  Constitutional: She is oriented to person, place, and time. She appears well-developed and well-nourished.  HENT:  Head: Normocephalic and atraumatic.  Neck: Normal range of motion. Neck supple.  Cardiovascular: Normal rate, regular rhythm, normal heart sounds and intact distal pulses.   Pulmonary/Chest: Effort normal and breath sounds normal. She exhibits no tenderness.  Musculoskeletal: Normal range of motion. She exhibits no edema or tenderness.  Lymphadenopathy:    She has no cervical adenopathy.  Neurological: She is alert and oriented to person, place, and time.  Skin: Skin is warm and dry.  Psychiatric: She has a normal mood and affect. Her behavior is normal. Judgment and thought content normal.        Assessment & Plan:  1. Midline thoracic back pain, unspecified chronicity Apply Diclofenac cream to mid back area. Tramadol 50-100 mg every 8 hours as needed for pain.  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  Return for follow-up as needed.   Godfrey PickKimberly S. Tiburcio PeaHarris, MSN, FNP-C [facility] [phone]

## 2017-02-08 NOTE — Patient Instructions (Addendum)
Apply Diclofenac cream to mid back area.  Tramadol 50-100 mg every 8 hours as needed for pain.    IF you received an x-ray today, you will receive an invoice from [facility]. Please contact [facility] at [phone] with questions or concerns regarding your invoice.   IF you received labwork today, you will receive an invoice from [facility]. Please contact [facility] at [phone] with questions or concerns regarding your invoice.   Our billing staff will not be able to assist you with questions regarding bills from these companies.  You will be contacted with the lab results as soon as they are available. The fastest way to get your results is to activate your My Chart account. Instructions are located on the last page of this paperwork. If you have not heard from us regarding the results in 2 weeks, please contact this office.

## 2019-12-27 ENCOUNTER — Other Ambulatory Visit: Payer: Self-pay

## 2019-12-27 ENCOUNTER — Encounter (HOSPITAL_COMMUNITY): Payer: Self-pay | Admitting: Emergency Medicine

## 2019-12-27 ENCOUNTER — Emergency Department (HOSPITAL_COMMUNITY)
Admission: EM | Admit: 2019-12-27 | Discharge: 2019-12-27 | Disposition: A | Discharge status: Home / Self Care | Payer: BC Managed Care – PPO | Attending: Emergency Medicine | Admitting: Emergency Medicine

## 2019-12-27 ENCOUNTER — Emergency Department (HOSPITAL_COMMUNITY): Payer: BC Managed Care – PPO

## 2019-12-27 DIAGNOSIS — Z041 Encounter for examination and observation following transport accident: Secondary | ICD-10-CM | POA: Diagnosis present

## 2019-12-27 DIAGNOSIS — M25561 Pain in right knee: Secondary | ICD-10-CM | POA: Diagnosis not present

## 2019-12-27 DIAGNOSIS — M25531 Pain in right wrist: Secondary | ICD-10-CM | POA: Insufficient documentation

## 2019-12-27 MED ORDER — CYCLOBENZAPRINE HCL 10 MG PO TABS
5.0000 mg | ORAL_TABLET | Freq: Once | ORAL | Status: AC
  Administered 2019-12-27: 22:00:00 5 mg via ORAL
  Filled 2019-12-27: qty 1

## 2019-12-27 MED ORDER — ACETAMINOPHEN 325 MG PO TABS
650.0000 mg | ORAL_TABLET | Freq: Once | ORAL | Status: AC
  Administered 2019-12-27: 22:00:00 650 mg via ORAL
  Filled 2019-12-27: qty 2
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
## 2019-12-27 NOTE — ED Notes (Signed)
Patient verbalizes understanding of discharge instructions. Opportunity for questioning and answers were provided. Armband removed by staff, pt discharged from ED. Pt. ambulatory and discharged home.  
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
## 2019-12-27 NOTE — ED Notes (Signed)
Pt is complain of Right: Hand ,Elbow, and Knee Pain.

## 2020-03-11 ENCOUNTER — Other Ambulatory Visit: Payer: Self-pay | Admitting: Physical Medicine and Rehabilitation

## 2020-03-11 DIAGNOSIS — M4312 Spondylolisthesis, cervical region: Secondary | ICD-10-CM

## 2020-03-11 DIAGNOSIS — M25511 Pain in right shoulder: Secondary | ICD-10-CM

## 2020-03-11 DIAGNOSIS — M5412 Radiculopathy, cervical region: Secondary | ICD-10-CM

## 2020-04-09 ENCOUNTER — Ambulatory Visit
Admission: RE | Admit: 2020-04-09 | Discharge: 2020-04-09 | Disposition: A | Discharge status: Home / Self Care | Payer: BC Managed Care – PPO | Source: Ambulatory Visit | Attending: Physical Medicine and Rehabilitation | Admitting: Physical Medicine and Rehabilitation

## 2020-04-09 ENCOUNTER — Ambulatory Visit
Admission: RE | Admit: 2020-04-09 | Discharge: 2020-04-09 | Disposition: A | Discharge status: Home / Self Care | Payer: Medicaid Other | Source: Ambulatory Visit | Attending: Physical Medicine and Rehabilitation | Admitting: Physical Medicine and Rehabilitation

## 2020-04-09 ENCOUNTER — Other Ambulatory Visit: Payer: Self-pay

## 2020-04-09 DIAGNOSIS — M25511 Pain in right shoulder: Secondary | ICD-10-CM

## 2020-04-09 DIAGNOSIS — M4312 Spondylolisthesis, cervical region: Secondary | ICD-10-CM

## 2020-04-09 DIAGNOSIS — M5412 Radiculopathy, cervical region: Secondary | ICD-10-CM

## 2020-05-17 ENCOUNTER — Inpatient Hospital Stay: Admission: RE | Admit: 2020-05-17 | Payer: Medicaid Other | Source: Ambulatory Visit

## 2020-09-24 ENCOUNTER — Other Ambulatory Visit: Payer: Self-pay

## 2020-09-24 ENCOUNTER — Encounter (HOSPITAL_COMMUNITY): Payer: Self-pay | Admitting: Emergency Medicine

## 2020-09-24 ENCOUNTER — Emergency Department (HOSPITAL_COMMUNITY)
Admission: EM | Admit: 2020-09-24 | Discharge: 2020-09-25 | Disposition: A | Discharge status: Home / Self Care | Payer: No Typology Code available for payment source | Attending: Emergency Medicine | Admitting: Emergency Medicine

## 2020-09-24 DIAGNOSIS — T63441A Toxic effect of venom of bees, accidental (unintentional), initial encounter: Secondary | ICD-10-CM | POA: Diagnosis not present

## 2020-09-24 DIAGNOSIS — R21 Rash and other nonspecific skin eruption: Secondary | ICD-10-CM | POA: Insufficient documentation

## 2020-09-24 NOTE — ED Triage Notes (Addendum)
Pt presents to ED POV. Pt c/o swelling d/t  multiple bee stings on hand and RLE. Pt denies and airway of resp issues. NAD

## 2020-09-25 MED ORDER — DIPHENHYDRAMINE HCL 25 MG PO CAPS: 25.0000 mg | ORAL_CAPSULE | Freq: Four times a day (QID) | ORAL | 0 refills | Status: AC | PRN

## 2020-09-25 MED ORDER — IBUPROFEN 600 MG PO TABS: 600.0000 mg | ORAL_TABLET | Freq: Four times a day (QID) | ORAL | 0 refills | Status: DC | PRN

## 2020-09-25 MED ORDER — HYDROCORTISONE 1 % EX CREA: TOPICAL_CREAM | CUTANEOUS | 0 refills | Status: AC

## 2020-09-25 NOTE — ED Provider Notes (Signed)
[facility] EMERGENCY DEPARTMENT Provider Note   CSN: [id number] Arrival date & time: 09/24/20  1950     History Chief Complaint  Patient presents with  . Insect Bite    Lindsey Rasmussen is a 29 y.o. female.  HPI     29 year old female comes in a chief complaint of insect bite.  She reports that she was stung by bees multiple times over her finger, leg.  She did not have any respiratory complaints or oral swelling but she did have significant swelling to her finger causing pain.  The pain is described as throbbing.  There is no current itching.  Past Medical History:  Diagnosis Date  . No pertinent past medical history   . SVD (spontaneous vaginal delivery) 05/06/2012    There are no problems to display for this patient.   Past Surgical History:  Procedure Laterality Date  . HERNIA REPAIR     bilateral at 4 mos     OB History    Gravida  1   Para  1   Term  1   Preterm      AB      Living  1     SAB      TAB      Ectopic      Multiple      Live Births  1           Family History  Problem Relation Age of Onset  . Anxiety disorder Mother   . Arthritis Mother   . Depression Mother   . Cancer Maternal Grandmother        breast  . Stroke Maternal Grandmother   . Heart disease Maternal Grandmother   . Stroke Paternal Grandmother   . Heart attack Paternal Grandfather   . Heart disease Maternal Grandfather   . Stroke Maternal Grandfather     Social History   Tobacco Use  . Smoking status: Never Smoker  . Smokeless tobacco: Never Used  Substance Use Topics  . Alcohol use: No  . Drug use: Yes    Types: Marijuana    Home Medications Prior to Admission medications   Medication Sig Start Date End Date Taking? Authorizing Provider  benzonatate (TESSALON) 100 MG capsule Take 1 capsule (100 mg total) by mouth every 8 (eight) hours. 01/25/17   Garlon Hatchet, PA-C  Diclofenac Sodium 3 % CREA Apply 1 application topically 3  (three) times daily as needed. 02/08/17   Bing Neighbors, FNP  diphenhydrAMINE (BENADRYL) 25 mg capsule Take 1 capsule (25 mg total) by mouth every 6 (six) hours as needed for itching. 09/25/20   Derwood Kaplan, MD  etonogestrel (NEXPLANON) 68 MG IMPL implant 1 each by Subdermal route once. Placed 2013    [provider]  hydrocortisone cream 1 % Apply to affected area 2 times daily, 5 days max 09/25/20   Derwood Kaplan, MD  ibuprofen (ADVIL) 600 MG tablet Take 1 tablet (600 mg total) by mouth every 6 (six) hours as needed. 09/25/20   Derwood Kaplan, MD  naproxen (NAPROSYN) 500 MG tablet Take 1 tablet (500 mg total) by mouth 2 (two) times daily with a meal. 11/25/16   Bing Neighbors, FNP  ondansetron (ZOFRAN ODT) 4 MG disintegrating tablet Take 1 tablet (4 mg total) by mouth every 8 (eight) hours as needed for nausea. 01/25/17   Garlon Hatchet, PA-C  traMADol (ULTRAM) 50 MG tablet Take 1-2 tablets (50-100 mg total) by mouth  every 8 (eight) hours as needed. 02/08/17   Bing Neighbors, FNP    Allergies    Patient has no known allergies.  Review of Systems   Review of Systems  Constitutional: Positive for activity change.  Respiratory: Negative for shortness of breath.   Musculoskeletal: Positive for arthralgias.  Skin: Positive for rash.  Allergic/Immunologic: Negative for environmental allergies and food allergies.    Physical Exam Updated Vital Signs BP 134/86   Pulse 78   Temp 98.4 F (36.9 C) (Oral)   Resp 18   Ht 6' (1.829 m)   Wt 108 kg   SpO2 100%   BMI 32.28 kg/m   Physical Exam Vitals and nursing note reviewed.  Constitutional:      Appearance: She is well-developed.  HENT:     Head: Normocephalic and atraumatic.  Cardiovascular:     Rate and Rhythm: Normal rate.  Pulmonary:     Effort: Pulmonary effort is normal.  Abdominal:     General: Bowel sounds are normal.  Musculoskeletal:        General: Swelling present.     Cervical back: Normal  range of motion and neck supple.  Skin:    General: Skin is warm and dry.     Findings: Bruising and rash present.  Neurological:     Mental Status: She is alert and oriented to person, place, and time.     ED Results / Procedures / Treatments   Labs (all labs ordered are listed, but only abnormal results are displayed) Labs Reviewed - No data to display  EKG None  Radiology No results found.  Procedures Procedures (including critical care time)  Medications Ordered in ED Medications - No data to display  ED Course  I have reviewed the triage vital signs and the nursing notes.  Pertinent labs & imaging results that were available during my care of the patient were reviewed by me and considered in my medical decision making (see chart for details).    MDM Rules/Calculators/A&P                          29 year old female comes in with chief complaint of multiple bee sting.  The incident occurred about 4 hours prior to my assessment.  She has no respiratory compromise and no evidence of anaphylaxis.  She is having localized hypersensitivity over her finger, for which we will give her topical medication and advocate rice.  Final Clinical Impression(s) / ED Diagnoses Final diagnoses:  Bee sting, accidental or unintentional, initial encounter  Bee sting reaction, accidental or unintentional, initial encounter    Rx / DC Orders ED Discharge Orders         Ordered    hydrocortisone cream 1 %        09/25/20 0126    diphenhydrAMINE (BENADRYL) 25 mg capsule  Every 6 hours PRN        09/25/20 0126    ibuprofen (ADVIL) 600 MG tablet  Every 6 hours PRN        09/25/20 0126           Derwood Kaplan, MD 09/25/20 [phone]

## 2020-09-25 NOTE — ED Notes (Signed)
Patient verbalizes understanding of discharge instructions. Opportunity for questioning and answers were provided. Armband removed by staff, pt discharged from ED ambulatory to home.  

## 2020-09-25 NOTE — Discharge Instructions (Signed)
Ice the area of sting to keep the swelling down and take the additional prescribed medicine for supportive symptom control if you hare having severe pain, itching.

## 2020-12-09 ENCOUNTER — Ambulatory Visit (HOSPITAL_COMMUNITY)
Admission: EM | Admit: 2020-12-09 | Discharge: 2020-12-09 | Disposition: A | Discharge status: Home / Self Care | Payer: No Typology Code available for payment source | Attending: Student | Admitting: Student

## 2020-12-09 ENCOUNTER — Encounter (HOSPITAL_COMMUNITY): Payer: Self-pay

## 2020-12-09 ENCOUNTER — Other Ambulatory Visit: Payer: Self-pay

## 2020-12-09 DIAGNOSIS — L03012 Cellulitis of left finger: Secondary | ICD-10-CM

## 2020-12-09 MED ORDER — AMOXICILLIN-POT CLAVULANATE 875-125 MG PO TABS: 1.0000 | ORAL_TABLET | Freq: Two times a day (BID) | ORAL | 0 refills | Status: DC

## 2020-12-09 NOTE — ED Provider Notes (Signed)
[facility]    CSN: [id number] Arrival date & time: 12/09/20  4401      History   Chief Complaint Chief Complaint  Patient presents with  . Hand Pain    Left middle finger    HPI Lindsey Rasmussen is a 29 y.o. female presenting for left middle finger pain for 3 days. States that she pulled off a hangnail from her left middle finger 3 days ago. Since then, the tip of her finger has gotten red and painful. It's been a problem with her job, as she works at [employer] and needs to use her hands. Denies fevers/chills, is feeling well otherwise.   HPI  Past Medical History:  Diagnosis Date  . No pertinent past medical history   . SVD (spontaneous vaginal delivery) 05/06/2012    There are no problems to display for this patient.   Past Surgical History:  Procedure Laterality Date  . HERNIA REPAIR     bilateral at 4 mos    OB History    Gravida  1   Para  1   Term  1   Preterm      AB      Living  1     SAB      IAB      Ectopic      Multiple      Live Births  1            Home Medications    Prior to Admission medications   Medication Sig Start Date End Date Taking? Authorizing Provider  benzonatate (TESSALON) 100 MG capsule Take 1 capsule (100 mg total) by mouth every 8 (eight) hours. 01/25/17   Garlon Hatchet, PA-C  Diclofenac Sodium 3 % CREA Apply 1 application topically 3 (three) times daily as needed. 02/08/17   Bing Neighbors, FNP  diphenhydrAMINE (BENADRYL) 25 mg capsule Take 1 capsule (25 mg total) by mouth every 6 (six) hours as needed for itching. 09/25/20   Derwood Kaplan, MD  etonogestrel (NEXPLANON) 68 MG IMPL implant 1 each by Subdermal route once. Placed 2013    [provider]  hydrocortisone cream 1 % Apply to affected area 2 times daily, 5 days max 09/25/20   Derwood Kaplan, MD  ibuprofen (ADVIL) 600 MG tablet Take 1 tablet (600 mg total) by mouth every 6 (six) hours as needed. 09/25/20   Derwood Kaplan, MD   naproxen (NAPROSYN) 500 MG tablet Take 1 tablet (500 mg total) by mouth 2 (two) times daily with a meal. 11/25/16   Bing Neighbors, FNP  ondansetron (ZOFRAN ODT) 4 MG disintegrating tablet Take 1 tablet (4 mg total) by mouth every 8 (eight) hours as needed for nausea. 01/25/17   Garlon Hatchet, PA-C  traMADol (ULTRAM) 50 MG tablet Take 1-2 tablets (50-100 mg total) by mouth every 8 (eight) hours as needed. 02/08/17   Bing Neighbors, FNP    Family History Family History  Problem Relation Age of Onset  . Anxiety disorder Mother   . Arthritis Mother   . Depression Mother   . Cancer Maternal Grandmother        breast  . Stroke Maternal Grandmother   . Heart disease Maternal Grandmother   . Stroke Paternal Grandmother   . Heart attack Paternal Grandfather   . Heart disease Maternal Grandfather   . Stroke Maternal Grandfather     Social History Social History   Tobacco Use  . Smoking status: Never Smoker  .  Smokeless tobacco: Never Used  Substance Use Topics  . Alcohol use: No  . Drug use: Yes    Types: Marijuana     Allergies   Patient has no known allergies.   Review of Systems Review of Systems  Constitutional: Negative for chills and fever.  Skin:       Tip of left middle finger is painful, red and swollen  All other systems reviewed and are negative.    Physical Exam Triage Vital Signs ED Triage Vitals [12/09/20 0838]  Enc Vitals Group     BP      Pulse      Resp      Temp      Temp src      SpO2      Weight      Height      Head Circumference      Peak Flow      Pain Score 6     Pain Loc      Pain Edu?      Excl. in GC?    No data found.  Updated Vital Signs There were no vitals taken for this visit.  Visual Acuity Right Eye Distance:   Left Eye Distance:   Bilateral Distance:    Right Eye Near:   Left Eye Near:    Bilateral Near:     Physical Exam Vitals reviewed.  Constitutional:      General: She is not in acute  distress.    Appearance: Normal appearance. She is not ill-appearing.  HENT:     Head: Normocephalic and atraumatic.  Cardiovascular:     Rate and Rhythm: Normal rate and regular rhythm.     Heart sounds: Normal heart sounds.  Pulmonary:     Effort: Pulmonary effort is normal.     Breath sounds: Normal breath sounds.  Skin:    Comments: Distal left middle finger- nail surrounded by erythema, swelling, and warmth. The area feels indurated. Small 73mmx2mm pocket of pus visualized but not able to be expressed. ROM intact.   Neurological:     Mental Status: She is alert.      UC Treatments / Results  Labs (all labs ordered are listed, but only abnormal results are displayed) Labs Reviewed - No data to display  EKG   Radiology No results found.  Procedures Procedures (including critical care time)  Medications Ordered in UC Medications - No data to display  Initial Impression / Assessment and Plan / UC Course  I have reviewed the triage vital signs and the nursing notes.  Pertinent labs & imaging results that were available during my care of the patient were reviewed by me and considered in my medical decision making (see chart for details).     Augmentin as below for paronychia. She can also try soaking the finger and applying antibacterial cream. Return precautions- worsening symptoms despite the above regimen; fevers/chills; etc. Patient verbalizes understanding and agreement with the above treatment plan.  Final Clinical Impressions(s) / UC Diagnoses   Final diagnoses:  None   Discharge Instructions   None    ED Prescriptions    None     PDMP not reviewed this encounter.   Rhys Martini, PA-C 12/09/20 [phone]

## 2020-12-09 NOTE — ED Triage Notes (Signed)
Pt presents with a swollen left middle finger X 3 days. Pt states she pulled the hang nail and noticed it began to swell days after. Pt states she also noticed puss and redness.

## 2020-12-09 NOTE — Discharge Instructions (Addendum)
For your infected finger, start the antibiotic (Augmentin). You can take this with food if it irritates your stomach. You can also try soaking the finger in warm water, and applying antibacterial cream. Seek additional medical attention if the finger worsens despite this regimen, if you develop fevers, etc.

## 2020-12-23 IMAGING — MR MR SHOULDER*R* W/O CM
4 of 5 series · 21 of 40 positions shown · non-contrast
Comparison: Right shoulder x-rays dated February 15, 2015.

CLINICAL DATA: Right shoulder pain.

EXAM:
MRI OF THE RIGHT SHOULDER WITHOUT CONTRAST
TECHNIQUE: Multiplanar, multisequence MR imaging of the shoulder was performed.
No intravenous contrast was administered.

[Series 6: PD fat-sat · axial · right · 4.0mm · 0.44mm/px · z∈[-50,+60]mm · 8 of 24 slices shown (1 of 2)]
[im 1/24]
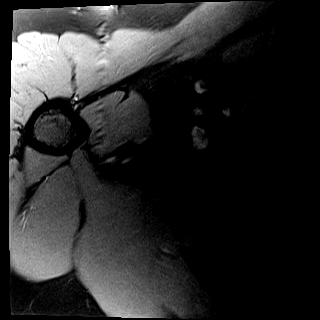
[im 4/24]
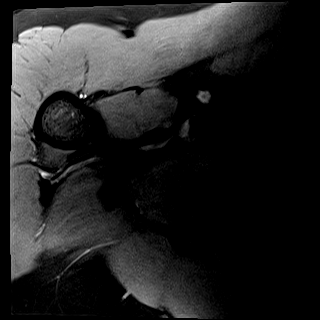
[im 7/24]
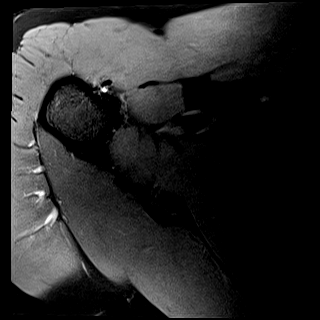
[im 10/24]
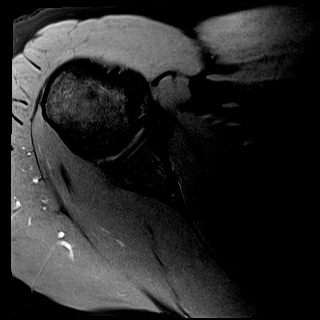
[im 14/24]
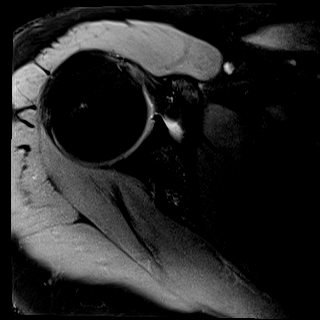
[im 17/24]
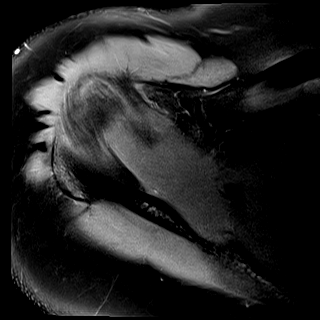
[im 20/24]
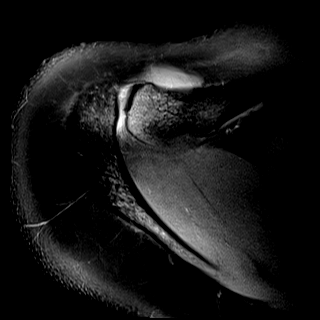
[im 24/24]
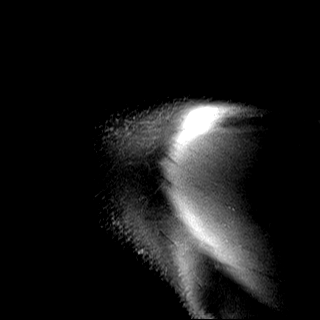

[Series 7: T2 fat-sat · oblique · right · 4.0mm · 0.22mm/px · 3 of 21 slices shown (1 of 2)]
[im 3/21]
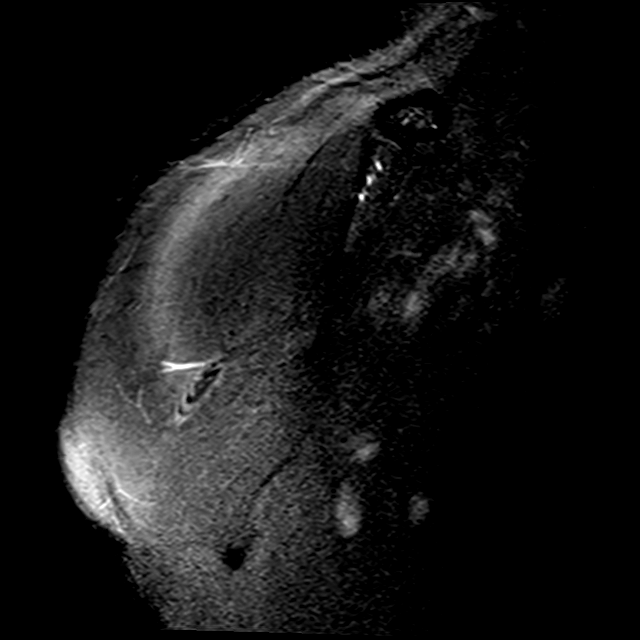
[im 12/21]
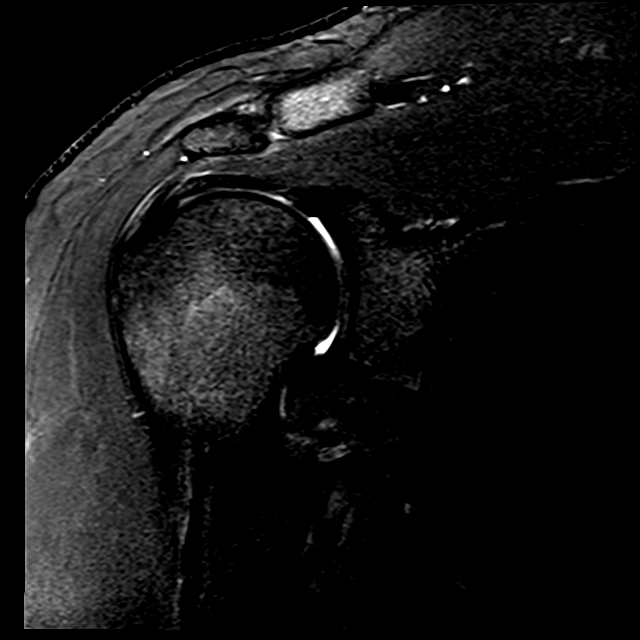
[im 18/21]
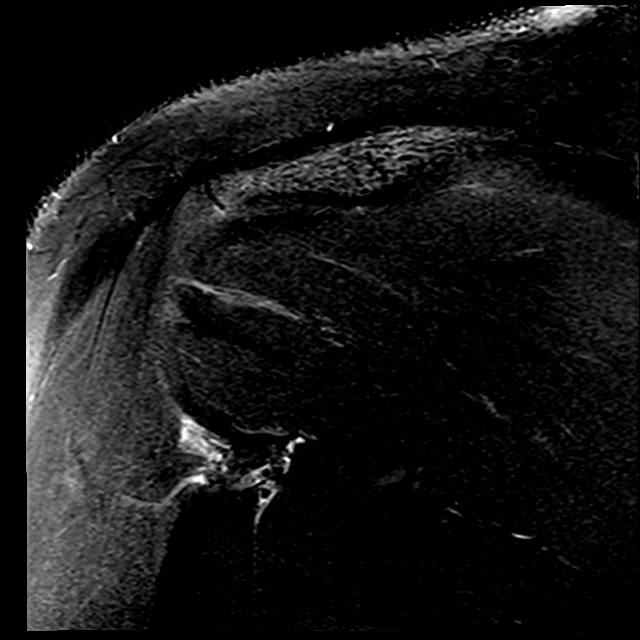

[Series 8: T2 fat-sat · oblique · right · 4.0mm · 0.44mm/px · 3 of 23 slices shown (2 of 2)]
[im 4/23]
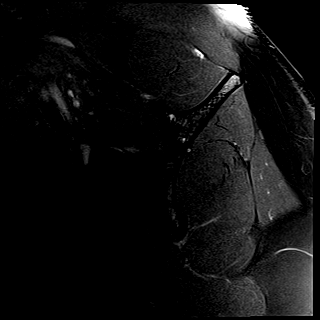
[im 13/23]
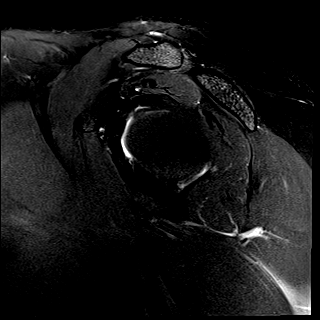
[im 19/23]
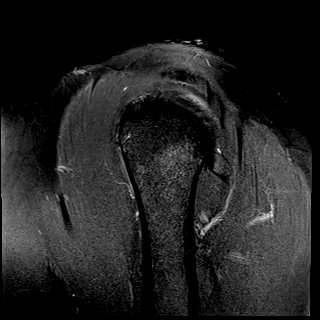

[Series 9: PD fat-sat · oblique · right · 4.0mm · 0.22mm/px · 7 of 21 slices shown (2 of 2)]
[im 1/21]
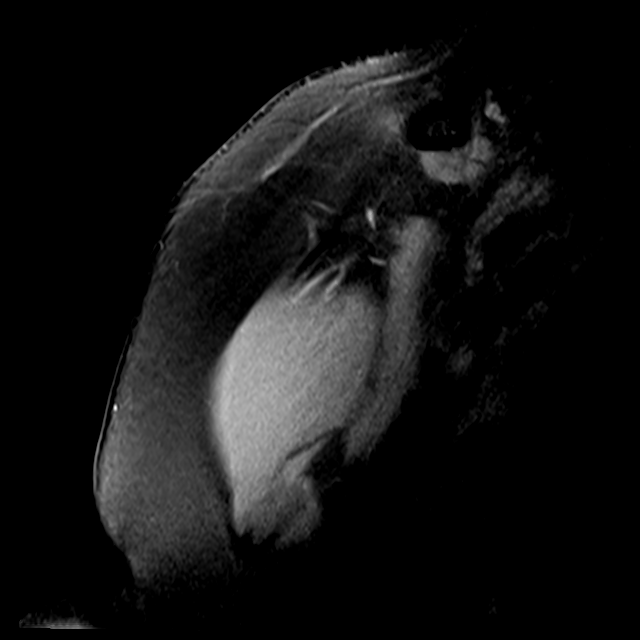
[im 3/21]
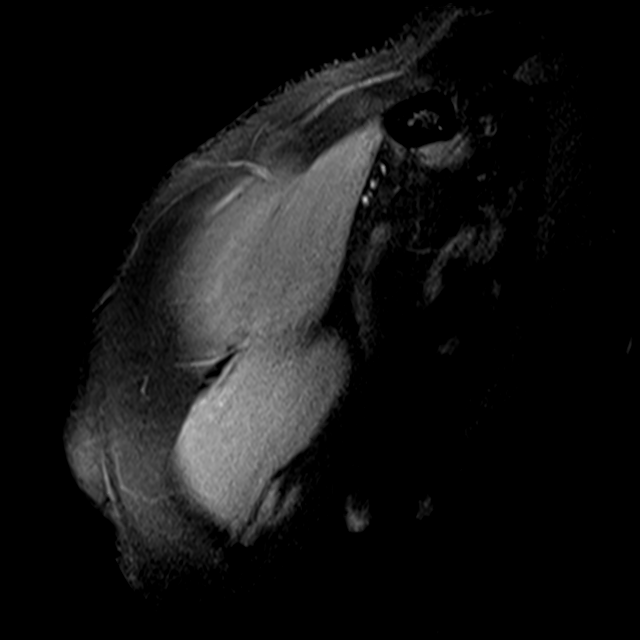
[im 6/21]
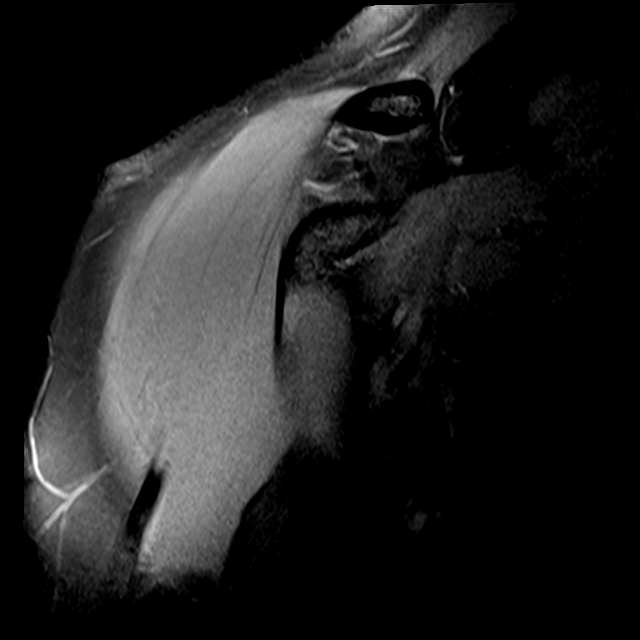
[im 9/21]
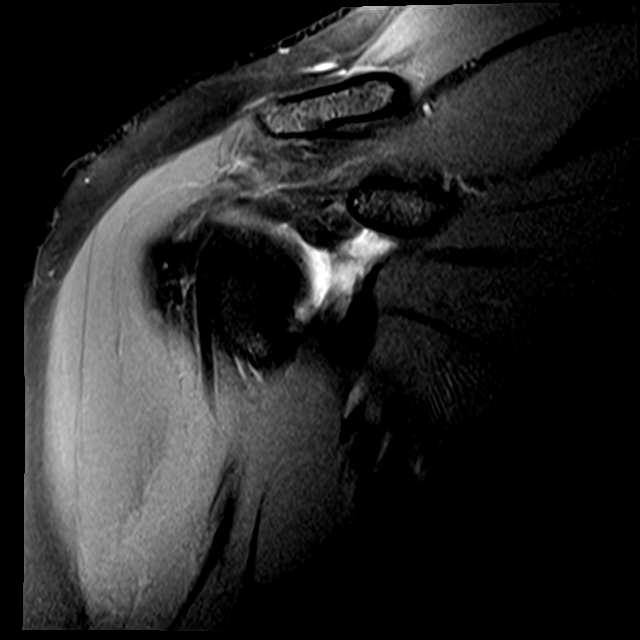
[im 12/21]
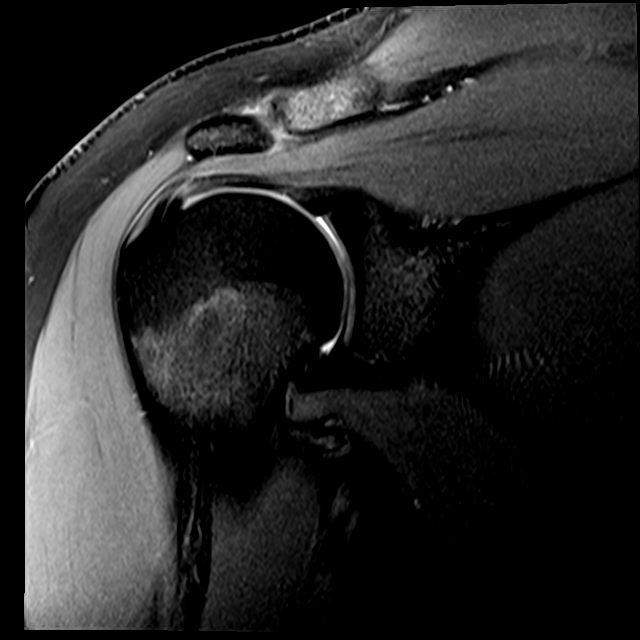
[im 15/21]
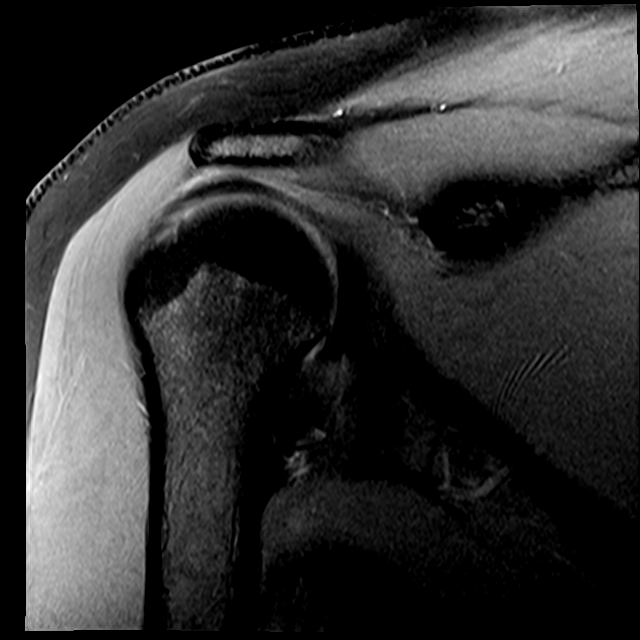
[im 18/21]
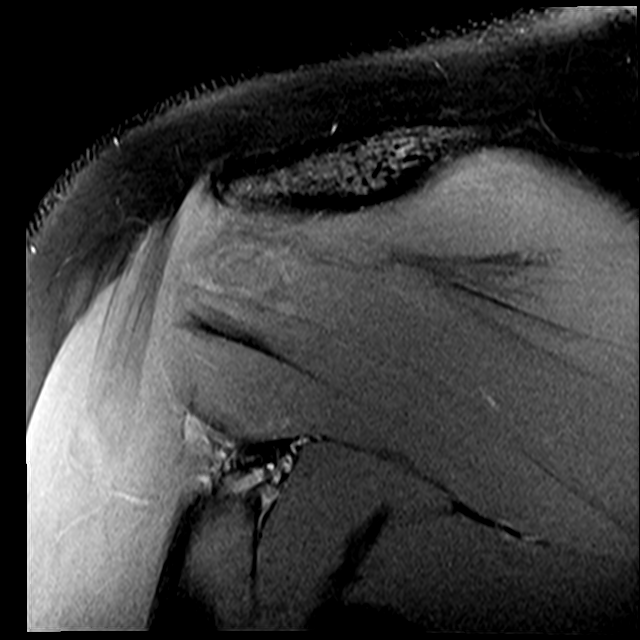

[21 of 40 positions shown; findings below may reference images not displayed]

FINDINGS: Rotator cuff:  Intact rotator cuff. No significant tendinosis.

Muscles: No atrophy or abnormal signal of the muscles of the rotator
cuff.

Biceps long head:  Intact and normally positioned.

Acromioclavicular Joint: Normal acromioclavicular joint. Type II
acromion. No subacromial/subdeltoid bursal fluid.

Glenohumeral Joint: No joint effusion. No chondral defect.

Labrum: Small amount of fluid signal undercutting the
posteroinferior labrum (series 6, image 13), consistent with tear.
Remaining labrum is grossly intact, although evaluation is limited
due to lack of intra-articular fluid.

Bones:  No marrow abnormality, fracture or dislocation.

Other: None.
IMPRESSION: 1. Small posteroinferior labral tear.
2. Intact rotator cuff without significant tendinosis.

## 2021-12-23 ENCOUNTER — Encounter (HOSPITAL_COMMUNITY): Payer: Self-pay

## 2021-12-23 ENCOUNTER — Ambulatory Visit (HOSPITAL_COMMUNITY)
Admission: EM | Admit: 2021-12-23 | Discharge: 2021-12-23 | Disposition: A | Discharge status: Home / Self Care | Payer: Medicaid Other | Attending: Urgent Care | Admitting: Urgent Care

## 2021-12-23 ENCOUNTER — Other Ambulatory Visit: Payer: Self-pay

## 2021-12-23 DIAGNOSIS — U071 COVID-19: Secondary | ICD-10-CM | POA: Diagnosis not present

## 2021-12-23 DIAGNOSIS — R6883 Chills (without fever): Secondary | ICD-10-CM | POA: Diagnosis present

## 2021-12-23 DIAGNOSIS — B349 Viral infection, unspecified: Secondary | ICD-10-CM | POA: Diagnosis not present

## 2021-12-23 LAB — POC INFLUENZA A AND B ANTIGEN (URGENT CARE ONLY)
INFLUENZA A ANTIGEN, POC: NEGATIVE
INFLUENZA B ANTIGEN, POC: NEGATIVE

## 2021-12-23 LAB — SARS CORONAVIRUS 2 (TAT 6-24 HRS): SARS Coronavirus 2: POSITIVE — AB

## 2021-12-23 NOTE — ED Triage Notes (Signed)
Pt presents with chills, weakness, and fatigue since waking up this morning.

## 2021-12-23 NOTE — Discharge Instructions (Addendum)
Your flu test was negative. Because of your symptoms just starting acutely this morning, there is a chance this could be a false negative test. We will send your covid test to the lab. We should receive results back within 24 hours. Please stay out of work until results received. Rest and increase hydration.  Alternate ibuprofen/ tylenol as needed for body aches or fever. May try OTC oscillococcinum as well for body aches.

## 2021-12-23 NOTE — ED Provider Notes (Signed)
[facility]    CSN: [id number] Arrival date & time: 12/23/21  1012      History   Chief Complaint Chief Complaint  Patient presents with   Chills   Fatigue   Weakness    HPI Lindsey Rasmussen is a 30 y.o. female.   Pleasant 30 year old female presents today with concerns of severe chills, fatigue, myalgias since 3:00 this morning.  She states last evening when she went to bed she felt completely fine, but woke up at 3 AM feeling terrible.  She has been fully vaccinated against COVID, her last vaccine was in October 2021.  She is uncertain if she got the flu vaccine this year.  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  She denies any cough, shortness of breath, headache.  She tried over-the-counter medication this morning but feels that her symptoms are progressing rapidly.   Weakness Associated symptoms: myalgias   Associated symptoms: no arthralgias, no drooling, no fever and no nausea    Past Medical History:  Diagnosis Date   No pertinent past medical history    SVD (spontaneous vaginal delivery) 05/06/2012    There are no problems to display for this patient.   Past Surgical History:  Procedure Laterality Date   HERNIA REPAIR     bilateral at 4 mos    OB History     Gravida  1   Para  1   Term  1   Preterm      AB      Living  1      SAB      IAB      Ectopic      Multiple      Live Births  1            Home Medications    Prior to Admission medications   Medication Sig Start Date End Date Taking? Authorizing Provider  amoxicillin-clavulanate (AUGMENTIN) 875-125 MG tablet Take 1 tablet by mouth every 12 (twelve) hours. 12/09/20   Rhys Martini, PA-C  benzonatate (TESSALON) 100 MG capsule Take 1 capsule (100 mg total) by mouth every 8 (eight) hours. 01/25/17   Garlon Hatchet, PA-C  Diclofenac Sodium 3 % CREA Apply 1 application topically 3 (three) times daily as needed. 02/08/17   Bing Neighbors, FNP  diphenhydrAMINE (BENADRYL) 25 mg capsule Take 1 capsule (25 mg total) by mouth every 6 (six) hours as needed for itching. 09/25/20   Derwood Kaplan, MD  etonogestrel (NEXPLANON) 68 MG IMPL implant 1 each by Subdermal route once. Placed 2013    [provider]  hydrocortisone cream 1 % Apply to affected area 2 times daily, 5 days max 09/25/20   Derwood Kaplan, MD  ibuprofen (ADVIL) 600 MG tablet Take 1 tablet (600 mg total) by mouth every 6 (six) hours as needed. 09/25/20   Derwood Kaplan, MD  naproxen (NAPROSYN) 500 MG tablet Take 1 tablet (500 mg total) by mouth 2 (two) times daily with a meal. 11/25/16   Bing Neighbors, FNP  ondansetron (ZOFRAN ODT) 4 MG disintegrating tablet Take 1 tablet (4 mg total) by mouth every 8 (eight) hours as needed for nausea. 01/25/17   Garlon Hatchet, PA-C  traMADol (ULTRAM) 50 MG tablet Take 1-2 tablets (50-100 mg total) by mouth every 8 (eight) hours as needed. 02/08/17   Bing Neighbors, FNP    Family History Family History  Problem Relation Age of  Onset   Anxiety disorder Mother    Arthritis Mother    Depression Mother    Cancer Maternal Grandmother        breast   Stroke Maternal Grandmother    Heart disease Maternal Grandmother    Stroke Paternal Grandmother    Heart attack Paternal Grandfather    Heart disease Maternal Grandfather    Stroke Maternal Grandfather     Social History Social History   Tobacco Use   Smoking status: Never   Smokeless tobacco: Never  Substance Use Topics   Alcohol use: No   Drug use: Yes    Types: Marijuana     Allergies   Patient has no known allergies.   Review of Systems Review of Systems  Constitutional:  Positive for activity change, chills and fatigue. Negative for fever.  HENT:  Negative for congestion, dental problem, drooling, ear discharge, ear pain, facial swelling, hearing loss, mouth sores, nosebleeds, postnasal drip, rhinorrhea, sinus pressure, sinus pain,  sneezing and sore throat.   Eyes: Negative.   Respiratory: Negative.    Cardiovascular: Negative.   Gastrointestinal:  Negative for nausea.  Endocrine: Negative.   Genitourinary: Negative.   Musculoskeletal:  Positive for myalgias. Negative for arthralgias and back pain.  Skin: Negative.   Neurological:  Positive for weakness.  Hematological: Negative.   Psychiatric/Behavioral: Negative.    All other systems reviewed and are negative.   Physical Exam Triage Vital Signs ED Triage Vitals  Enc Vitals Group     BP 12/23/21 1151 (!) 158/90     Pulse Rate 12/23/21 1151 98     Resp 12/23/21 1151 17     Temp 12/23/21 1151 98.7 F (37.1 C)     Temp Source 12/23/21 1151 Oral     SpO2 12/23/21 1151 99 %     Weight --      Height --      Head Circumference --      Peak Flow --      Pain Score 12/23/21 1152 2     Pain Loc --      Pain Edu? --      Excl. in GC? --    No data found.  Updated Vital Signs BP (!) 158/90 (BP Location: Right Arm)    Pulse 98    Temp 98.7 F (37.1 C) (Oral)    Resp 17    SpO2 99%   Visual Acuity Right Eye Distance:   Left Eye Distance:   Bilateral Distance:    Right Eye Near:   Left Eye Near:    Bilateral Near:     Physical Exam Vitals and nursing note reviewed.  Constitutional:      General: She is not in acute distress.    Appearance: She is well-developed. She is obese. She is ill-appearing. She is not toxic-appearing or diaphoretic.  HENT:     Head: Normocephalic and atraumatic.     Right Ear: Tympanic membrane, ear canal and external ear normal. There is no impacted cerumen.     Left Ear: Tympanic membrane, ear canal and external ear normal. There is no impacted cerumen.     Nose: Nose normal. No congestion or rhinorrhea.     Mouth/Throat:     Mouth: Mucous membranes are moist.     Pharynx: No oropharyngeal exudate or posterior oropharyngeal erythema.  Eyes:     General: No scleral icterus.       Right eye: No discharge.  Left  eye: No discharge.     Extraocular Movements: Extraocular movements intact.     Conjunctiva/sclera: Conjunctivae normal.     Pupils: Pupils are equal, round, and reactive to light.  Cardiovascular:     Rate and Rhythm: Normal rate and regular rhythm.     Pulses: Normal pulses.     Heart sounds: Normal heart sounds. No murmur heard.   No gallop.  Pulmonary:     Effort: Pulmonary effort is normal. No respiratory distress.     Breath sounds: Normal breath sounds. No stridor. No wheezing, rhonchi or rales.  Chest:     Chest wall: No tenderness.  Abdominal:     Palpations: Abdomen is soft.     Tenderness: There is no abdominal tenderness.  Musculoskeletal:        General: No swelling or tenderness. Normal range of motion.     Cervical back: Normal range of motion and neck supple. No tenderness.     Right lower leg: No edema.     Left lower leg: No edema.  Lymphadenopathy:     Cervical: No cervical adenopathy.  Skin:    General: Skin is warm and dry.     Capillary Refill: Capillary refill takes less than 2 seconds.  Neurological:     Mental Status: She is alert.  Psychiatric:        Mood and Affect: Mood normal.     UC Treatments / Results  Labs (all labs ordered are listed, but only abnormal results are displayed) Labs Reviewed  SARS CORONAVIRUS 2 (TAT 6-24 HRS)  POC INFLUENZA A AND B ANTIGEN (URGENT CARE ONLY)    EKG   Radiology No results found.  Procedures Procedures (including critical care time)  Medications Ordered in UC Medications - No data to display  Initial Impression / Assessment and Plan / UC Course  I have reviewed the triage vital signs and the nursing notes.  Pertinent labs & imaging results that were available during my care of the patient were reviewed by me and considered in my medical decision making (see chart for details).     Viral syndrome -COVID test pending, patient aware of a possibility of a false negative flu test given symptom  onset and timeframe of testing.  Supportive care for now, out of work until [employer] test results obtained.  Final Clinical Impressions(s) / UC Diagnoses   Final diagnoses:  Acute viral syndrome     Discharge Instructions      Your flu test was negative. Because of your symptoms just starting acutely this morning, there is a chance this could be a false negative test. We will send your covid test to the lab. We should receive results back within 24 hours. Please stay out of work until results received. Rest and increase hydration.  Alternate ibuprofen/ tylenol as needed for body aches or fever. May try OTC oscillococcinum as well for body aches.     ED Prescriptions   None    PDMP not reviewed this encounter.   Maretta Bees, [place] 12/23/21 1312

## 2022-02-11 ENCOUNTER — Ambulatory Visit (HOSPITAL_COMMUNITY)
Admission: EM | Admit: 2022-02-11 | Discharge: 2022-02-11 | Disposition: A | Discharge status: Home / Self Care | Payer: Medicaid Other | Attending: Family Medicine | Admitting: Family Medicine

## 2022-02-11 ENCOUNTER — Other Ambulatory Visit: Payer: Self-pay

## 2022-02-11 ENCOUNTER — Encounter (HOSPITAL_COMMUNITY): Payer: Self-pay

## 2022-02-11 DIAGNOSIS — R220 Localized swelling, mass and lump, head: Secondary | ICD-10-CM

## 2022-02-11 NOTE — ED Provider Notes (Signed)
°  [facility]   [id number] 02/11/22 Arrival Time: 1158  ASSESSMENT & PLAN:  1. Right facial swelling    Has resolved with Benadryl. No swallowing/resp difficulties. OTC symptom care as needed. Work note provided upon request.   Follow-up Information     [facility] [place].   Specialty: Urgent Care Why: If worsening or failing to improve as anticipated. Contact information: [address] [place] [address] [phone]                Reviewed expectations re: course of current medical issues. Questions answered. Outlined signs and symptoms indicating need for more acute intervention. Understanding verbalized. After Visit Summary given.   SUBJECTIVE: History from: Patient. Mindie Rawdon is a 31 y.o. female. Reports: R sided "face swelling"; around mouth; last evening; slight itching. Benadryl and resolved. Needs work note.  OBJECTIVE:  Vitals:   02/11/22 1255  BP: 138/88  Pulse: 83  Resp: 16  Temp: 98.5 F (36.9 C)  TempSrc: Oral  SpO2: 100%    General appearance: alert; no distress Eyes: PERRLA; EOMI; conjunctiva normal HENT: [place]; AT; without nasal congestion; without facial/oral swelling Neck: supple without LAD Lungs: speaks full sentences without difficulty; unlabored Extremities: no edema Skin: warm and dry Neurologic: normal gait Psychological: alert and cooperative; normal mood and affect   No Known Allergies  Past Medical History:  Diagnosis Date   No pertinent past medical history    SVD (spontaneous vaginal delivery) 05/06/2012   Social History   Socioeconomic History   Marital status: Single    Spouse name: Not on file   Number of children: Not on file   Years of education: Not on file   Highest education level: Not on file  Occupational History   Not on file  Tobacco Use   Smoking status: Never   Smokeless tobacco: Never  Substance and Sexual Activity   Alcohol use: No    Drug use: Yes    Types: Marijuana   Sexual activity: Yes    Birth control/protection: Implant  Other Topics Concern   Not on file  Social History Narrative   Not on file   Social Determinants of Health   Financial Resource Strain: Not on file  Food Insecurity: Not on file  Transportation Needs: Not on file  Physical Activity: Not on file  Stress: Not on file  Social Connections: Not on file  Intimate Partner Violence: Not on file   Family History  Problem Relation Age of Onset   Anxiety disorder Mother    Arthritis Mother    Depression Mother    Cancer Maternal Grandmother        breast   Stroke Maternal Grandmother    Heart disease Maternal Grandmother    Stroke Paternal Grandmother    Heart attack Paternal Grandfather    Heart disease Maternal Grandfather    Stroke Maternal Grandfather    Past Surgical History:  Procedure Laterality Date   HERNIA REPAIR     bilateral at 4 mos     Mardella Layman, MD 02/11/22 1359

## 2022-02-11 NOTE — ED Triage Notes (Signed)
Pt presents for right side swelling x 1-2 days. She took benadryl last night with no relief.

## 2022-04-15 ENCOUNTER — Encounter (HOSPITAL_COMMUNITY): Payer: Self-pay

## 2022-04-15 ENCOUNTER — Ambulatory Visit (HOSPITAL_COMMUNITY)
Admission: EM | Admit: 2022-04-15 | Discharge: 2022-04-15 | Disposition: A | Discharge status: Home / Self Care | Payer: Medicaid Other | Attending: Internal Medicine | Admitting: Internal Medicine

## 2022-04-15 DIAGNOSIS — M940 Chondrocostal junction syndrome [Tietze]: Secondary | ICD-10-CM

## 2022-04-15 MED ORDER — IBUPROFEN 600 MG PO TABS: 600.0000 mg | ORAL_TABLET | Freq: Four times a day (QID) | ORAL | 0 refills | Status: AC | PRN

## 2022-04-15 NOTE — Discharge Instructions (Signed)
Heating pad use on a 20-minute on-20 minutes of cycle twice a day ?Gentle stretching exercises ?Take NSAIDs as prescribed ?Reassurance given that this is unlikely to be cardiac. ?

## 2022-04-15 NOTE — ED Triage Notes (Signed)
Pt presents with central chest pain for almost 2 weeks. ?

## 2022-04-16 NOTE — ED Provider Notes (Signed)
?[facility] ? ? ? ?CSN: [id number] ?Arrival date & time: 04/15/22  [id number] ? ? ?  ? ?History   ?Chief Complaint ?Chief Complaint  ?Patient presents with  ? Chest Pain  ? ? ?HPI ?Lindsey Rasmussen is a 31 y.o. female comes to the urgent care with sharp precordial right sided chest pain of 2 weeks duration.  Patient denies any falls or trauma to the chest.  Pain is of moderate severity, sharp and along the border of the  right side of the breastbone.  Pain is aggravated by movement with no known relieving factors.  No nausea, vomiting or diarrhea.  Pain does not radiate to the left shoulder.  No shortness of breath, diaphoresis, dizziness or near fainting episode.  Patient denies any history of coronary artery disease.  No family history of early cardiac death.  ? ?HPI ? ?Past Medical History:  ?Diagnosis Date  ? No pertinent past medical history   ? SVD (spontaneous vaginal delivery) 05/06/2012  ? ? ?There are no problems to display for this patient. ? ? ?Past Surgical History:  ?Procedure Laterality Date  ? HERNIA REPAIR    ? bilateral at 4 mos  ? ? ?OB History   ? ? Gravida  ?1  ? Para  ?1  ? Term  ?1  ? Preterm  ?   ? AB  ?   ? Living  ?1  ?  ? ? SAB  ?   ? IAB  ?   ? Ectopic  ?   ? Multiple  ?   ? Live Births  ?1  ?   ?  ?  ? ? ? ?Home Medications   ? ?Prior to Admission medications   ?Medication Sig Start Date End Date Taking? Authorizing Provider  ?ibuprofen (ADVIL) 600 MG tablet Take 1 tablet (600 mg total) by mouth every 6 (six) hours as needed. 04/15/22  Yes Jamear Carbonneau, Britta MccreedyPhilip O, MD  ?benzonatate (TESSALON) 100 MG capsule Take 1 capsule (100 mg total) by mouth every 8 (eight) hours. 01/25/17   Garlon HatchetSanders, Lisa M, PA-C  ?Diclofenac Sodium 3 % CREA Apply 1 application topically 3 (three) times daily as needed. 02/08/17   Bing NeighborsHarris, Kimberly S, FNP  ?diphenhydrAMINE (BENADRYL) 25 mg capsule Take 1 capsule (25 mg total) by mouth every 6 (six) hours as needed for itching. 09/25/20   Derwood KaplanNanavati, Ankit, MD  ?etonogestrel  (NEXPLANON) 68 MG IMPL implant 1 each by Subdermal route once. Placed 2013    [provider]  ?hydrocortisone cream 1 % Apply to affected area 2 times daily, 5 days max 09/25/20   Derwood KaplanNanavati, Ankit, MD  ?ondansetron (ZOFRAN ODT) 4 MG disintegrating tablet Take 1 tablet (4 mg total) by mouth every 8 (eight) hours as needed for nausea. 01/25/17   Garlon HatchetSanders, Lisa M, PA-C  ?traMADol (ULTRAM) 50 MG tablet Take 1-2 tablets (50-100 mg total) by mouth every 8 (eight) hours as needed. 02/08/17   Bing NeighborsHarris, Kimberly S, FNP  ? ? ?Family History ?Family History  ?Problem Relation Age of Onset  ? Anxiety disorder Mother   ? Arthritis Mother   ? Depression Mother   ? Cancer Maternal Grandmother   ?     breast  ? Stroke Maternal Grandmother   ? Heart disease Maternal Grandmother   ? Stroke Paternal Grandmother   ? Heart attack Paternal Grandfather   ? Heart disease Maternal Grandfather   ? Stroke Maternal Grandfather   ? ? ?Social History ?Social History  ? ?  Tobacco Use  ? Smoking status: Never  ? Smokeless tobacco: Never  ?Substance Use Topics  ? Alcohol use: No  ? Drug use: Yes  ?  Types: Marijuana  ? ? ? ?Allergies   ?Patient has no known allergies. ? ? ?Review of Systems ?Review of Systems ?As per HPI ? ?Physical Exam ?Triage Vital Signs ?ED Triage Vitals  ?Enc Vitals Group  ?   BP 04/15/22 1010 (!) 157/95  ?   Pulse Rate 04/15/22 1010 (!) 104  ?   Resp 04/15/22 1010 18  ?   Temp 04/15/22 1010 99.2 ?F (37.3 ?C)  ?   Temp Source 04/15/22 1010 Oral  ?   SpO2 04/15/22 1010 98 %  ?   Weight --   ?   Height --   ?   Head Circumference --   ?   Peak Flow --   ?   Pain Score 04/15/22 1011 5  ?   Pain Loc --   ?   Pain Edu? --   ?   Excl. in GC? --   ? ?No data found. ? ?Updated Vital Signs ?BP (!) 157/95 (BP Location: Left Arm)   Pulse (!) 104   Temp 99.2 ?F (37.3 ?C) (Oral)   Resp 18   SpO2 98%  ? ?Visual Acuity ?Right Eye Distance:   ?Left Eye Distance:   ?Bilateral Distance:   ? ?Right Eye Near:   ?Left Eye Near:    ?Bilateral  Near:    ? ?Physical Exam ?Vitals and nursing note reviewed.  ?Constitutional:   ?   General: She is not in acute distress. ?   Appearance: She is obese. She is not ill-appearing.  ?Cardiovascular:  ?   Rate and Rhythm: Normal rate and regular rhythm.  ?   Heart sounds: Normal heart sounds.  ?Pulmonary:  ?   Breath sounds: No decreased breath sounds or wheezing.  ?Chest:  ?   Chest wall: Tenderness present.  ?   Comments: Tenderness over the right costochondral joints.  No swelling or bruising noted. ?Musculoskeletal:  ?   Cervical back: Normal range of motion and neck supple.  ?Neurological:  ?   Mental Status: She is alert.  ? ? ? ?UC Treatments / Results  ?Labs ?(all labs ordered are listed, but only abnormal results are displayed) ?Labs Reviewed - No data to display ? ?EKG ? ? ?Radiology ?No results found. ? ?Procedures ?Procedures (including critical care time) ? ?Medications Ordered in UC ?Medications - No data to display ? ?Initial Impression / Assessment and Plan / UC Course  ?I have reviewed the triage vital signs and the nursing notes. ? ?Pertinent labs & imaging results that were available during my care of the patient were reviewed by me and considered in my medical decision making (see chart for details). ? ?  ? ?1.  Right-sided acute costochondritis: ?Gentle stretching exercises ?Heating pad use only 20 minutes on-20 minutes off cycle ?Ibuprofen as needed for pain/or fever ?Return precautions given. ?Final Clinical Impressions(s) / UC Diagnoses  ? ?Final diagnoses:  ?Acute costochondritis  ? ? ? ?Discharge Instructions   ? ?  ?Heating pad use on a 20-minute on-20 minutes of cycle twice a day ?Gentle stretching exercises ?Take NSAIDs as prescribed ?Reassurance given that this is unlikely to be cardiac. ? ? ?ED Prescriptions   ? ? Medication Sig Dispense Auth. Provider  ? ibuprofen (ADVIL) 600 MG tablet Take 1 tablet (600 mg total) by mouth  every 6 (six) hours as needed. 30 tablet Otis Burress, Britta Mccreedy, MD   ? ?  ? ?PDMP not reviewed this encounter. ?  ?Merrilee Jansky, MD ?04/16/22 1456 ? ?

## 2022-10-20 ENCOUNTER — Emergency Department (HOSPITAL_BASED_OUTPATIENT_CLINIC_OR_DEPARTMENT_OTHER)
Admission: EM | Admit: 2022-10-20 | Discharge: 2022-10-20 | Disposition: A | Discharge status: Home / Self Care | Payer: Medicaid Other | Attending: Emergency Medicine | Admitting: Emergency Medicine

## 2022-10-20 ENCOUNTER — Encounter (HOSPITAL_BASED_OUTPATIENT_CLINIC_OR_DEPARTMENT_OTHER): Payer: Self-pay

## 2022-10-20 ENCOUNTER — Other Ambulatory Visit: Payer: Self-pay

## 2022-10-20 DIAGNOSIS — R42 Dizziness and giddiness: Secondary | ICD-10-CM | POA: Diagnosis present

## 2022-10-20 DIAGNOSIS — R030 Elevated blood-pressure reading, without diagnosis of hypertension: Secondary | ICD-10-CM

## 2022-10-20 DIAGNOSIS — I10 Essential (primary) hypertension: Secondary | ICD-10-CM | POA: Diagnosis not present

## 2022-10-20 DIAGNOSIS — R7309 Other abnormal glucose: Secondary | ICD-10-CM | POA: Diagnosis not present

## 2022-10-20 LAB — BASIC METABOLIC PANEL
Anion gap: 11 (ref 5–15)
BUN: 16 mg/dL (ref 6–20)
CO2: 22 mmol/L (ref 22–32)
Calcium: 9.9 mg/dL (ref 8.9–10.3)
Chloride: 104 mmol/L (ref 98–111)
Creatinine, Ser: 1.02 mg/dL — ABNORMAL HIGH (ref 0.44–1.00)
GFR, Estimated: >60 mL/min (ref >60)
Glucose, Bld: 117 mg/dL — ABNORMAL HIGH (ref 70–99)
Potassium: 3.6 mmol/L (ref 3.5–5.1)
Sodium: 137 mmol/L (ref 135–145)

## 2022-10-20 LAB — CBC
HCT: 42.6 % (ref 36.0–46.0)
Hemoglobin: 14.3 g/dL (ref 12.0–15.0)
MCH: 30.7 pg (ref 26.0–34.0)
MCHC: 33.6 g/dL (ref 30.0–36.0)
MCV: 91.4 fL (ref 80.0–100.0)
Platelets: 311 10*3/uL (ref 150–400)
RBC: 4.66 MIL/uL (ref 3.87–5.11)
RDW: 12.1 % (ref 11.5–15.5)
WBC: 6.6 10*3/uL (ref 4.0–10.5)
nRBC: 0 % (ref 0.0–0.2)

## 2022-10-20 LAB — CBG MONITORING, ED: Glucose-Capillary: 109 mg/dL — ABNORMAL HIGH (ref 70–99)

## 2022-10-20 MED ORDER — HYDROCHLOROTHIAZIDE 25 MG PO TABS: 25.0000 mg | ORAL_TABLET | Freq: Every day | ORAL | 2 refills | Status: AC

## 2022-10-20 NOTE — ED Provider Notes (Signed)
[facility] Provider Note   CSN: [id number] Arrival date & time: 10/20/22  1908     History  Chief Complaint  Patient presents with   Hypertension   Dizziness    Lindsey Rasmussen is a 31 y.o. female.  The history is provided by the patient.  Hypertension  Dizziness She has no significant past history and comes in because she is concerned that her blood pressure has been running high and she needs to be on medication for it.  For the last several months, she has been having intermittent headaches and dizzy spells.  She has difficulty describing the headaches are, but dizziness is momentary lightheadedness when she stands.  She is not currently having a headache.  She has also been having intermittent sweats.  She had 1 episode of chest pain this afternoon which was a sharp pain in the midsternal area which lasted about 10 minutes before resolving.  There was no associated dyspnea, nausea and there is no diaphoresis at that time.  Her sister works at an urgent care center and told her that her symptoms were likely from her high blood pressure and she needed to come to the emergency department to get started on treatment.  There is a strong family history of hypertension.   Home Medications Prior to Admission medications   Medication Sig Start Date End Date Taking? Authorizing Provider  benzonatate (TESSALON) 100 MG capsule Take 1 capsule (100 mg total) by mouth every 8 (eight) hours. 01/25/17   Larene Pickett, PA-C  Diclofenac Sodium 3 % CREA Apply 1 application topically 3 (three) times daily as needed. 02/08/17   Scot Jun, FNP  diphenhydrAMINE (BENADRYL) 25 mg capsule Take 1 capsule (25 mg total) by mouth every 6 (six) hours as needed for itching. 09/25/20   Varney Biles, MD  etonogestrel (NEXPLANON) 68 MG IMPL implant 1 each by Subdermal route once. Placed 2013    [provider]  hydrocortisone cream 1 % Apply to affected area 2 times  daily, 5 days max 09/25/20   Varney Biles, MD  ibuprofen (ADVIL) 600 MG tablet Take 1 tablet (600 mg total) by mouth every 6 (six) hours as needed. 04/15/22   Lamptey, Myrene Galas, MD  ondansetron (ZOFRAN ODT) 4 MG disintegrating tablet Take 1 tablet (4 mg total) by mouth every 8 (eight) hours as needed for nausea. 01/25/17   Larene Pickett, PA-C  traMADol (ULTRAM) 50 MG tablet Take 1-2 tablets (50-100 mg total) by mouth every 8 (eight) hours as needed. 02/08/17   Scot Jun, FNP      Allergies    Patient has no known allergies.    Review of Systems   Review of Systems  Neurological:  Positive for dizziness.  All other systems reviewed and are negative.   Physical Exam Updated Vital Signs BP (!) 132/103   Pulse 84   Temp 97.7 F (36.5 C)   Resp 20   Ht 6\' 1"  (1.854 m)   Wt 114.8 kg   SpO2 100%   BMI 33.38 kg/m  Physical Exam Vitals and nursing note reviewed.   32 year old female, resting comfortably and in no acute distress. Vital signs are significant for elevated blood pressure. Oxygen saturation is 100%, which is normal. Head is normocephalic and atraumatic. PERRLA, EOMI. Oropharynx is clear. Neck is nontender and supple without adenopathy or JVD. Back is nontender and there is no CVA tenderness. Lungs are clear without rales, wheezes, or rhonchi.  Chest is nontender. Heart has regular rate and rhythm without murmur. Abdomen is soft, flat, nontender. Extremities have no cyanosis or edema, full range of motion is present. Skin is warm and dry without rash. Neurologic: Mental status is normal, cranial nerves are intact, moves all extremities equally.  ED Results / Procedures / Treatments   Labs (all labs ordered are listed, but only abnormal results are displayed) Labs Reviewed  BASIC METABOLIC PANEL - Abnormal; Notable for the following components:      Result Value   Glucose, Bld 117 (*)    Creatinine, Ser 1.02 (*)    All other components within normal limits   CBG MONITORING, ED - Abnormal; Notable for the following components:   Glucose-Capillary 109 (*)    All other components within normal limits  CBC  URINALYSIS, ROUTINE W REFLEX MICROSCOPIC  PREGNANCY, URINE    EKG EKG Interpretation  Date/Time:  Wednesday October 20 2022 19:27:55 EDT Ventricular Rate:  100 PR Interval:  156 QRS Duration: 84 QT Interval:  332 QTC Calculation: 428 R Axis:   39 Text Interpretation: Normal sinus rhythm Possible Anterior infarct , age undetermined Abnormal ECG When compared with ECG of 12-Jul-2016 06:43, HEART RATE has decreased Rightward axis is no longer present Confirmed by Delora Fuel ([id number]) on 10/20/2022 10:58:39 PM  Radiology No results found.  Procedures Procedures    Medications Ordered in ED Medications - No data to display  ED Course/ Medical Decision Making/ A&P                           Medical Decision Making Amount and/or Complexity of Data Reviewed Labs: ordered.   Elevated blood pressure with no previous diagnosis of hypertension.  I have reviewed her past records, and blood pressure readings at emergency department and urgent care visits over the last several years have been consistently elevated.  It seems reasonable to start patient on medication, but I have advised her that she needs to obtain a primary care provider to monitor her blood pressure and adjust medication as needed.  I have reviewed and interpreted her laboratory tests, and my interpretation is mildly elevated random glucose level.  Have informed patient of this and need to follow that to make sure she does not develop diabetes.  I have reviewed and interpreted her electrocardiogram and my interpretation is sinus rhythm with possible old anterior wall myocardial infarction but given her age and lack of cardiac risk factors I doubt she actually has prior MI.  When compared with prior ECG, heart rate is significantly decreased and QRS morphology is similar.  I have  counseled patient on the need for home monitoring of blood pressure and need to maintain a low-salt diet.  I am prescribing hydrochlorothiazide for her blood pressure and encouraged her to obtain primary care provider.  She is not sure if her insurance covers primary care, I have given her financial resource page.  Final Clinical Impression(s) / ED Diagnoses Final diagnoses:  Elevated blood pressure reading without diagnosis of hypertension  Elevated random blood glucose level    Rx / DC Orders ED Discharge Orders          Ordered    hydrochlorothiazide (HYDRODIURIL) 25 MG tablet  Daily        10/20/22 3790              Delora Fuel, MD 24/09/73 2330

## 2022-10-20 NOTE — ED Triage Notes (Addendum)
Pt states that every time she comes to the emergency dept, she has high BP. Today she started having headaches, dizziness, and feeling "bad" and decided that she wanted to be checked out. Pt unsure of how high BP was today.

## 2022-10-20 NOTE — Discharge Instructions (Addendum)
Please obtain a blood pressure cuff and check your blood pressure once a day.  Keep a record of the readings.  Whenever you go into see your primary care provider, let them see what your blood pressure readings have been since the last visit.  Try to stay on a low-salt diet.  Try to get regular exercise.  Return to the emergency department if you have any new or concerning symptoms.

## 2023-08-19 DIAGNOSIS — Z20822 Contact with and (suspected) exposure to covid-19: Secondary | ICD-10-CM | POA: Diagnosis not present

## 2023-08-19 DIAGNOSIS — R21 Rash and other nonspecific skin eruption: Secondary | ICD-10-CM | POA: Diagnosis not present

## 2023-08-19 DIAGNOSIS — B349 Viral infection, unspecified: Secondary | ICD-10-CM | POA: Diagnosis not present

## 2023-08-31 DIAGNOSIS — B081 Molluscum contagiosum: Secondary | ICD-10-CM | POA: Diagnosis not present

## 2024-03-04 ENCOUNTER — Other Ambulatory Visit: Payer: Self-pay

## 2024-03-04 ENCOUNTER — Emergency Department
Admission: EM | Admit: 2024-03-04 | Discharge: 2024-03-04 | Disposition: A | Discharge status: Home / Self Care | Payer: Self-pay | Attending: Emergency Medicine | Admitting: Emergency Medicine

## 2024-03-04 ENCOUNTER — Emergency Department: Payer: Self-pay

## 2024-03-04 DIAGNOSIS — R42 Dizziness and giddiness: Secondary | ICD-10-CM | POA: Diagnosis not present

## 2024-03-04 DIAGNOSIS — I10 Essential (primary) hypertension: Secondary | ICD-10-CM | POA: Diagnosis not present

## 2024-03-04 DIAGNOSIS — R079 Chest pain, unspecified: Secondary | ICD-10-CM | POA: Insufficient documentation

## 2024-03-04 DIAGNOSIS — R002 Palpitations: Secondary | ICD-10-CM | POA: Diagnosis not present

## 2024-03-04 DIAGNOSIS — R000 Tachycardia, unspecified: Secondary | ICD-10-CM | POA: Diagnosis not present

## 2024-03-04 LAB — HEPATIC FUNCTION PANEL
ALT: 13 U/L (ref 0–44)
AST: 19 U/L (ref 15–41)
Albumin: 4.2 g/dL (ref 3.5–5.0)
Alkaline Phosphatase: 49 U/L (ref 38–126)
Bilirubin, Direct: <0.1 mg/dL (ref 0.0–0.2)
Total Bilirubin: 0.7 mg/dL (ref 0.0–1.2)
Total Protein: 7.7 g/dL (ref 6.5–8.1)

## 2024-03-04 LAB — CBC
HCT: 38.1 % (ref 36.0–46.0)
Hemoglobin: 12.9 g/dL (ref 12.0–15.0)
MCH: 30.8 pg (ref 26.0–34.0)
MCHC: 33.9 g/dL (ref 30.0–36.0)
MCV: 90.9 fL (ref 80.0–100.0)
Platelets: 311 10*3/uL (ref 150–400)
RBC: 4.19 MIL/uL (ref 3.87–5.11)
RDW: 12.2 % (ref 11.5–15.5)
WBC: 5.3 10*3/uL (ref 4.0–10.5)
nRBC: 0 % (ref 0.0–0.2)

## 2024-03-04 LAB — BASIC METABOLIC PANEL
Anion gap: 8 (ref 5–15)
BUN: 11 mg/dL (ref 6–20)
CO2: 23 mmol/L (ref 22–32)
Calcium: 9.2 mg/dL (ref 8.9–10.3)
Chloride: 107 mmol/L (ref 98–111)
Creatinine, Ser: 0.89 mg/dL (ref 0.44–1.00)
GFR, Estimated: >60 mL/min (ref >60)
Glucose, Bld: 109 mg/dL — ABNORMAL HIGH (ref 70–99)
Potassium: 3.2 mmol/L — ABNORMAL LOW (ref 3.5–5.1)
Sodium: 138 mmol/L (ref 135–145)

## 2024-03-04 LAB — TSH: TSH: 0.988 u[IU]/mL (ref 0.350–4.500)

## 2024-03-04 LAB — MAGNESIUM: Magnesium: 1.9 mg/dL (ref 1.7–2.4)

## 2024-03-04 LAB — TROPONIN I (HIGH SENSITIVITY)
Troponin I (High Sensitivity): 18 ng/L — ABNORMAL HIGH (ref <18)
Troponin I (High Sensitivity): 19 ng/L — ABNORMAL HIGH (ref <18)

## 2024-03-04 LAB — D-DIMER, QUANTITATIVE: D-Dimer, Quant: <0.27 ug{FEU}/mL (ref 0.00–0.50)

## 2024-03-04 LAB — POC URINE PREG, ED: Preg Test, Ur: NEGATIVE

## 2024-03-04 LAB — T4, FREE: Free T4: 0.81 ng/dL (ref 0.61–1.12)

## 2024-03-04 MED ORDER — AMLODIPINE BESYLATE 5 MG PO TABS
5.0000 mg | ORAL_TABLET | Freq: Every day | ORAL | 2 refills | Status: AC
Start: 2024-03-04 — End: 2024-06-02

## 2024-03-04 MED ORDER — POTASSIUM CHLORIDE CRYS ER 20 MEQ PO TBCR
40.0000 meq | EXTENDED_RELEASE_TABLET | Freq: Once | ORAL | Status: AC
  Administered 2024-03-04: 40 meq via ORAL
  Filled 2024-03-04: qty 2

## 2024-03-04 NOTE — Discharge Instructions (Signed)
 You were seen in the Emergency Department today for evaluation of your chest pain. Fortunately, your labs, EKG, and chest x-Lindsey Rasmussen were overall reassuring against a emergency cause for your pain. Please follow-up with a primary doctor for reevaluation. You can take Tylenol and ibuprofen as needed for your pain, unless there is another reason that you should not take these. Return to the ER for any new or worsening symptoms including worsening chest pain, difficulty breathing, or any other new or concerning symptoms that you believe warrants immediate attention.   I have also sent a prescription for blood pressure medication to your pharmacy.  Please take this as directed until you are able to follow-up as an outpatient for further evaluation.

## 2024-03-04 NOTE — ED Triage Notes (Signed)
 Pt comes via EMS from work with cp and palpitations that is left sided. Pt stats it did radiate from shoulder to shoulder. Pt was given 324 aspirin and pain went down. Pt states shaking and dizziness. Pt denies any hx of this. Pt has hx of HTN and doesn't take meds bc of way it makes her feels.  CBG 121 VSS

## 2024-03-04 NOTE — ED Provider Notes (Signed)
 [facility] Provider Note    Event Date/Time   First MD Initiated Contact with Patient 03/04/24 1402     (approximate)   History   Tachycardia   HPI  Lindsey Rasmussen is a 33 year old female presenting to the emergency department for evaluation of chest pain and palpitations.  Symptoms started around 10 AM today.  Primarily left-sided pain but radiating across her shoulders.  Took 324 of aspirin with improvement.  Reports this feels similar to an episode little over a year ago which she was told may have been related to high blood pressure.  She was started on a blood pressure medication, but discontinued it as she thought it made her feel weird.  Reports that she is physically been feeling okay over the last few days, but has had significant recent stressors.  Reviewed ER visit from 10/20/2022.  At that time, patient presented with lightheadedness was noted to have elevated blood pressures, was started on HCTZ.     Physical Exam   Triage Vital Signs: ED Triage Vitals  Encounter Vitals Group     BP 03/04/24 1352 (!) 165/101     Systolic BP Percentile --      Diastolic BP Percentile --      Pulse Rate 03/04/24 1352 (!) 125     Resp 03/04/24 1352 18     Temp 03/04/24 1352 98 F (36.7 C)     Temp Source 03/04/24 1651 Oral     SpO2 03/04/24 1352 100 %     Weight 03/04/24 1351 253 lb (114.8 kg)     Height 03/04/24 1351 6\' 1"  (1.854 m)     Head Circumference --      Peak Flow --      Pain Score 03/04/24 1350 8     Pain Loc --      Pain Education --      Exclude from Growth Chart --     Most recent vital signs: Vitals:   03/04/24 1352 03/04/24 1651  BP: (!) 165/101 (!) 146/85  Pulse: (!) 125 96  Resp: 18 20  Temp: 98 F (36.7 C) 98 F (36.7 C)  SpO2: 100% 100%     General: Awake, interactive  CV:  Tachycardic with regular rhythm, normal peripheral perfusion Resp:  Unlabored respirations, lungs clear to auscultation Abd:  Nondistended,  soft, nontender to palpation Neuro:  Symmetric facial movement, fluid speech   ED Results / Procedures / Treatments   Labs (all labs ordered are listed, but only abnormal results are displayed) Labs Reviewed  BASIC METABOLIC PANEL - Abnormal; Notable for the following components:      Result Value   Potassium 3.2 (*)    Glucose, Bld 109 (*)    All other components within normal limits  TROPONIN I (HIGH SENSITIVITY) - Abnormal; Notable for the following components:   Troponin I (High Sensitivity) 18 (*)    All other components within normal limits  TROPONIN I (HIGH SENSITIVITY) - Abnormal; Notable for the following components:   Troponin I (High Sensitivity) 19 (*)    All other components within normal limits  CBC  HEPATIC FUNCTION PANEL  MAGNESIUM  TSH  T4, FREE  D-DIMER, QUANTITATIVE (NOT AT [facility])  POC URINE PREG, ED     EKG EKG independently reviewed interpreted by myself (ER attending) demonstrates:  EKG demonstrates sinus tachycardia rate of 108, PR 152, there is 88, QTc 444, no acute ST changes  RADIOLOGY Imaging independently reviewed and  interpreted by myself demonstrates:  CXR without focal consolidation  PROCEDURES:  Critical Care performed: No  Procedures   MEDICATIONS ORDERED IN ED: Medications  potassium chloride SA (KLOR-CON M) CR tablet 40 mEq (40 mEq Oral Given 03/04/24 1507)     IMPRESSION / MDM / ASSESSMENT AND PLAN / ED COURSE  I reviewed the triage vital signs and the nursing notes.  Differential diagnosis includes, but is not limited to, arrhythmia, anemia, electrolyte abnormality, ACS, pulmonary embolism  Patient's presentation is most consistent with acute presentation with potential threat to life or bodily function.  33 year old female presenting with chest pain and palpitations.  Tachycardic on presentation, did improve without intervention.  Labs with hypokalemia, orally repleted, normal magnesium.  Normal thyroid function.  Normal  D-dimer.  Troponin minimally elevated at 18, stable at 19 on repeat.  X-Amoura Ransier and EKG reassuring.  Overall low suspicion for significant acute pathology.  Low risk heart score.  Discussed importance of outpatient follow-up.  Was noted to be hypertensive here, with history of this as well.  Since she reported poor tolerance of HCTZ, will start her on amlodipine.  Strict return precautions provided.  Patient discharged in stable condition.      FINAL CLINICAL IMPRESSION(S) / ED DIAGNOSES   Final diagnoses:  Palpitations  Nonspecific chest pain  Uncontrolled hypertension     Rx / DC Orders   ED Discharge Orders          Ordered    amLODipine (NORVASC) 5 MG tablet  Daily        03/04/24 1726    Ambulatory Referral to Primary Care (Establish Care)       Comments: Hypertension, chest pain   03/04/24 1727             Note:  This document was prepared using Dragon voice recognition software and may include unintentional dictation errors.   Trinna Post, MD 03/04/24 [phone]

## 2024-03-13 DIAGNOSIS — R7309 Other abnormal glucose: Secondary | ICD-10-CM | POA: Diagnosis not present

## 2024-03-13 DIAGNOSIS — T887XXA Unspecified adverse effect of drug or medicament, initial encounter: Secondary | ICD-10-CM | POA: Diagnosis not present

## 2024-03-13 DIAGNOSIS — I10 Essential (primary) hypertension: Secondary | ICD-10-CM | POA: Diagnosis not present

## 2024-03-14 DIAGNOSIS — Z1329 Encounter for screening for other suspected endocrine disorder: Secondary | ICD-10-CM | POA: Diagnosis not present

## 2024-03-14 DIAGNOSIS — Z13220 Encounter for screening for lipoid disorders: Secondary | ICD-10-CM | POA: Diagnosis not present

## 2024-03-14 DIAGNOSIS — I10 Essential (primary) hypertension: Secondary | ICD-10-CM | POA: Diagnosis not present

## 2024-03-14 DIAGNOSIS — Z130 Encounter for screening for diseases of the blood and blood-forming organs and certain disorders involving the immune mechanism: Secondary | ICD-10-CM | POA: Diagnosis not present

## 2024-03-14 DIAGNOSIS — E876 Hypokalemia: Secondary | ICD-10-CM | POA: Diagnosis not present

## 2024-05-22 DIAGNOSIS — L819 Disorder of pigmentation, unspecified: Secondary | ICD-10-CM | POA: Diagnosis not present

## 2024-05-22 DIAGNOSIS — I10 Essential (primary) hypertension: Secondary | ICD-10-CM | POA: Diagnosis not present

## 2024-05-22 DIAGNOSIS — Z113 Encounter for screening for infections with a predominantly sexual mode of transmission: Secondary | ICD-10-CM | POA: Diagnosis not present

## 2024-05-22 DIAGNOSIS — F439 Reaction to severe stress, unspecified: Secondary | ICD-10-CM | POA: Diagnosis not present

## 2024-09-28 ENCOUNTER — Emergency Department (HOSPITAL_COMMUNITY)
Admission: EM | Admit: 2024-09-28 | Discharge: 2024-09-28 | Disposition: A | Discharge status: Home / Self Care | Payer: Self-pay | Attending: Emergency Medicine | Admitting: Emergency Medicine

## 2024-09-28 ENCOUNTER — Encounter (HOSPITAL_COMMUNITY): Payer: Self-pay

## 2024-09-28 ENCOUNTER — Emergency Department (HOSPITAL_COMMUNITY): Payer: Self-pay

## 2024-09-28 DIAGNOSIS — R079 Chest pain, unspecified: Secondary | ICD-10-CM | POA: Insufficient documentation

## 2024-09-28 DIAGNOSIS — R0789 Other chest pain: Secondary | ICD-10-CM

## 2024-09-28 LAB — CBC WITH DIFFERENTIAL/PLATELET
Abs Immature Granulocytes: 0.01 K/uL (ref 0.00–0.07)
Basophils Absolute: 0 K/uL (ref 0.0–0.1)
Basophils Relative: 0 %
Eosinophils Absolute: 0.7 K/uL — ABNORMAL HIGH (ref 0.0–0.5)
Eosinophils Relative: 13 %
HCT: 42.6 % (ref 36.0–46.0)
Hemoglobin: 13.9 g/dL (ref 12.0–15.0)
Immature Granulocytes: 0 %
Lymphocytes Relative: 45 %
Lymphs Abs: 2.6 K/uL (ref 0.7–4.0)
MCH: 30.3 pg (ref 26.0–34.0)
MCHC: 32.6 g/dL (ref 30.0–36.0)
MCV: 93 fL (ref 80.0–100.0)
Monocytes Absolute: 0.3 K/uL (ref 0.1–1.0)
Monocytes Relative: 6 %
Neutro Abs: 2.1 K/uL (ref 1.7–7.7)
Neutrophils Relative %: 36 %
Platelets: 316 K/uL (ref 150–400)
RBC: 4.58 MIL/uL (ref 3.87–5.11)
RDW: 12 % (ref 11.5–15.5)
WBC: 5.8 K/uL (ref 4.0–10.5)
nRBC: 0 % (ref 0.0–0.2)

## 2024-09-28 LAB — D-DIMER, QUANTITATIVE: D-Dimer, Quant: 0.66 ug{FEU}/mL — ABNORMAL HIGH (ref 0.00–0.50)

## 2024-09-28 LAB — HCG, SERUM, QUALITATIVE: Preg, Serum: NEGATIVE

## 2024-09-28 LAB — I-STAT CHEM 8, ED
BUN: 11 mg/dL (ref 6–20)
Calcium, Ion: 1.18 mmol/L (ref 1.15–1.40)
Chloride: 104 mmol/L (ref 98–111)
Creatinine, Ser: 1 mg/dL (ref 0.44–1.00)
Glucose, Bld: 97 mg/dL (ref 70–99)
HCT: 42 % (ref 36.0–46.0)
Hemoglobin: 14.3 g/dL (ref 12.0–15.0)
Potassium: 3.6 mmol/L (ref 3.5–5.1)
Sodium: 140 mmol/L (ref 135–145)
TCO2: 22 mmol/L (ref 22–32)

## 2024-09-28 MED ORDER — METHOCARBAMOL 500 MG PO TABS: 500.0000 mg | ORAL_TABLET | Freq: Two times a day (BID) | ORAL | 0 refills | Status: AC

## 2024-09-28 MED ORDER — IBUPROFEN 400 MG PO TABS: 400.0000 mg | ORAL_TABLET | Freq: Four times a day (QID) | ORAL | 0 refills | Status: AC | PRN

## 2024-09-28 MED ORDER — KETOROLAC TROMETHAMINE 30 MG/ML IJ SOLN
15.0000 mg | Freq: Once | INTRAMUSCULAR | Status: AC
  Administered 2024-09-28: 15 mg via INTRAVENOUS
  Filled 2024-09-28: qty 1

## 2024-09-28 MED ORDER — IOHEXOL 350 MG/ML SOLN
80.0000 mL | Freq: Once | INTRAVENOUS | Status: AC | PRN
Start: 2024-09-28 — End: 2024-09-28
  Administered 2024-09-28: 80 mL via INTRAVENOUS

## 2024-09-28 NOTE — ED Provider Notes (Signed)
 [place] EMERGENCY DEPARTMENT AT [facility] Provider Note   CSN: [id number] Arrival date & time: 09/28/24  1103     Patient presents with: Chest Pain and Anxiety   Lindsey Rasmussen is a 33 y.o. female.   34 year old female presents with sharp chest pain is worse with movement which began suddenly today.  Patient states she had a panic attack afterwards.  She experienced increased dyspnea, carpopedal spasm.  States that her chest pain was at rest and was not radiating and was reproducible.  Denied any fever cough congestion.  No exertional component to it.  No pleuritic component.  No leg pain or swelling.  Did not radiate to her back only status in of her chest.  No prior history of same.  No treatment use prior to arrival       Prior to Admission medications   Medication Sig Start Date End Date Taking? Authorizing Provider  amLODipine  (NORVASC ) 5 MG tablet Take 1 tablet (5 mg total) by mouth daily. 03/04/24 06/02/24  Levander Slate, MD  benzonatate  (TESSALON ) 100 MG capsule Take 1 capsule (100 mg total) by mouth every 8 (eight) hours. 01/25/17   Jarold Olam HERO, PA-C  Diclofenac  Sodium 3 % CREA Apply 1 application topically 3 (three) times daily as needed. 02/08/17   Arloa Suzen RAMAN, NP  diphenhydrAMINE  (BENADRYL ) 25 mg capsule Take 1 capsule (25 mg total) by mouth every 6 (six) hours as needed for itching. 09/25/20   Charlyn Sora, MD  etonogestrel (NEXPLANON) 68 MG IMPL implant 1 each by Subdermal route once. Placed 2013    [provider]  hydrochlorothiazide  (HYDRODIURIL ) 25 MG tablet Take 1 tablet (25 mg total) by mouth daily. 10/20/22   Raford Lenis, MD  hydrocortisone  cream 1 % Apply to affected area 2 times daily, 5 days max 09/25/20   Charlyn Sora, MD  ibuprofen  (ADVIL ) 600 MG tablet Take 1 tablet (600 mg total) by mouth every 6 (six) hours as needed. 04/15/22   Lamptey, Aleene KIDD, MD  ondansetron  (ZOFRAN  ODT) 4 MG disintegrating tablet Take 1 tablet (4 mg total)  by mouth every 8 (eight) hours as needed for nausea. 01/25/17   Jarold Olam HERO, PA-C  traMADol  (ULTRAM ) 50 MG tablet Take 1-2 tablets (50-100 mg total) by mouth every 8 (eight) hours as needed. 02/08/17   Arloa Suzen RAMAN, NP    Allergies: Patient has no known allergies.    Review of Systems  All other systems reviewed and are negative.   Updated Vital Signs BP (!) 154/88 (BP Location: Left Arm)   Pulse 86   Temp 98.3 F (36.8 C) (Oral)   Resp 16   LMP 09/26/2024 (Approximate)   SpO2 100%   Physical Exam Vitals and nursing note reviewed.  Constitutional:      General: She is not in acute distress.    Appearance: Normal appearance. She is well-developed. She is not toxic-appearing.  HENT:     Head: Normocephalic and atraumatic.  Eyes:     General: Lids are normal.     Conjunctiva/sclera: Conjunctivae normal.     Pupils: Pupils are equal, round, and reactive to light.  Neck:     Thyroid : No thyroid  mass.     Trachea: No tracheal deviation.  Cardiovascular:     Rate and Rhythm: Normal rate and regular rhythm.     Heart sounds: Normal heart sounds. No murmur heard.    No gallop.  Pulmonary:     Effort: Pulmonary effort is  normal. No respiratory distress.     Breath sounds: Normal breath sounds. No stridor. No decreased breath sounds, wheezing, rhonchi or rales.  Chest:    Abdominal:     General: There is no distension.     Palpations: Abdomen is soft.     Tenderness: There is no abdominal tenderness. There is no rebound.  Musculoskeletal:        General: No tenderness. Normal range of motion.     Cervical back: Normal range of motion and neck supple.  Skin:    General: Skin is warm and dry.     Findings: No abrasion or rash.  Neurological:     Mental Status: She is alert and oriented to person, place, and time. Mental status is at baseline.     GCS: GCS eye subscore is 4. GCS verbal subscore is 5. GCS motor subscore is 6.     Cranial Nerves: No cranial nerve  deficit.     Sensory: No sensory deficit.     Motor: Motor function is intact.  Psychiatric:        Attention and Perception: Attention normal.        Speech: Speech normal.        Behavior: Behavior normal.     (all labs ordered are listed, but only abnormal results are displayed) Labs Reviewed  CBC WITH DIFFERENTIAL/PLATELET  D-DIMER, QUANTITATIVE  HCG, SERUM, QUALITATIVE  I-STAT CHEM 8, ED    EKG: EKG Interpretation Date/Time:  Friday September 28 2024 11:08:11 EDT Ventricular Rate:  91 PR Interval:  157 QRS Duration:  98 QT Interval:  368 QTC Calculation: 453 R Axis:   15  Text Interpretation: Sinus rhythm Borderline T abnormalities, anterior leads No significant change since last tracing Confirmed by Dasie Faden ([id number]) on 09/28/2024 11:27:24 AM  Radiology: No results found.   Procedures   Medications Ordered in the ED - No data to display                                  Medical Decision Making Amount and/or Complexity of Data Reviewed Labs: ordered. Radiology: ordered.  Risk Prescription drug management.   EKG shows sinus rhythm no signs of acute coronary schema.  Chest x-ray without acute findings.  D-dimer is elevated here.  Concern for possible PE.  Patient subsequently had CT angio chest which showed no acute findings here.  Patient's pain is reducible on her anterior chest wall.  Given Toradol here.  Will discharge on anti-inflammatories as well as muscle relaxants     Final diagnoses:  None    ED Discharge Orders     None          Dasie Faden, MD 09/28/24 1427

## 2024-09-28 NOTE — ED Triage Notes (Signed)
 Pt presents with c/o chest pain. Pt reports she began hurting in her chest and began having a panic attack. Pt entered the ER screaming and yelling and hyperventilating. Pt reports tingling in her hands as well. Pt reports no hx of anxiety.

## 2024-09-28 NOTE — ED Notes (Signed)
 Patient transported to CT

## 2024-12-13 ENCOUNTER — Emergency Department (HOSPITAL_BASED_OUTPATIENT_CLINIC_OR_DEPARTMENT_OTHER)
Admission: EM | Admit: 2024-12-13 | Discharge: 2024-12-13 | Disposition: A | Discharge status: Home / Self Care | Payer: PRIVATE HEALTH INSURANCE | Attending: Emergency Medicine | Admitting: Emergency Medicine

## 2024-12-13 ENCOUNTER — Other Ambulatory Visit: Payer: Self-pay

## 2024-12-13 ENCOUNTER — Encounter (HOSPITAL_BASED_OUTPATIENT_CLINIC_OR_DEPARTMENT_OTHER): Payer: Self-pay

## 2024-12-13 ENCOUNTER — Emergency Department (HOSPITAL_BASED_OUTPATIENT_CLINIC_OR_DEPARTMENT_OTHER): Payer: PRIVATE HEALTH INSURANCE | Admitting: Radiology

## 2024-12-13 DIAGNOSIS — Z79899 Other long term (current) drug therapy: Secondary | ICD-10-CM | POA: Diagnosis not present

## 2024-12-13 DIAGNOSIS — I10 Essential (primary) hypertension: Secondary | ICD-10-CM | POA: Insufficient documentation

## 2024-12-13 DIAGNOSIS — M25562 Pain in left knee: Secondary | ICD-10-CM | POA: Insufficient documentation

## 2024-12-13 DIAGNOSIS — M79602 Pain in left arm: Secondary | ICD-10-CM | POA: Insufficient documentation

## 2024-12-13 DIAGNOSIS — M25561 Pain in right knee: Secondary | ICD-10-CM | POA: Diagnosis not present

## 2024-12-13 DIAGNOSIS — Y92410 Unspecified street and highway as the place of occurrence of the external cause: Secondary | ICD-10-CM | POA: Insufficient documentation

## 2024-12-13 DIAGNOSIS — M545 Low back pain, unspecified: Secondary | ICD-10-CM | POA: Insufficient documentation

## 2024-12-13 DIAGNOSIS — Z760 Encounter for issue of repeat prescription: Secondary | ICD-10-CM | POA: Insufficient documentation

## 2024-12-13 DIAGNOSIS — M7918 Myalgia, other site: Secondary | ICD-10-CM

## 2024-12-13 MED ORDER — LIDOCAINE 5 % EX PTCH: 2.0000 | MEDICATED_PATCH | CUTANEOUS | 0 refills | Status: AC

## 2024-12-13 MED ORDER — KETOROLAC TROMETHAMINE 15 MG/ML IJ SOLN
15.0000 mg | Freq: Once | INTRAMUSCULAR | Status: AC
  Administered 2024-12-13: 22:00:00 15 mg via INTRAMUSCULAR
  Filled 2024-12-13: qty 1

## 2024-12-13 MED ORDER — AMLODIPINE BESYLATE 5 MG PO TABS: 5.0000 mg | ORAL_TABLET | Freq: Every day | ORAL | 0 refills | Status: AC

## 2024-12-13 MED ORDER — METAXALONE 800 MG PO TABS: 800.0000 mg | ORAL_TABLET | Freq: Three times a day (TID) | ORAL | 0 refills | Status: AC

## 2024-12-13 MED ORDER — LIDOCAINE 5 % EX PTCH
2.0000 | MEDICATED_PATCH | CUTANEOUS | Status: DC
  Administered 2024-12-13: 22:00:00 2 via TRANSDERMAL
  Filled 2024-12-13: qty 2
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
